# Patient Record
Sex: Male | Born: 1955 | Race: White | Hispanic: No | Marital: Married | Smoking: Never smoker
Health system: Southern US, Community
[De-identification: ages and names within clinical notes are randomized; demographics above are authoritative.]

## PROBLEM LIST (undated history)

## (undated) DIAGNOSIS — I10 Essential (primary) hypertension: Secondary | ICD-10-CM

## (undated) DIAGNOSIS — M1990 Unspecified osteoarthritis, unspecified site: Secondary | ICD-10-CM

## (undated) HISTORY — PX: NO PAST SURGERIES: SHX2092

## (undated) HISTORY — PX: TONSILLECTOMY: SUR1361

---

## 2015-04-28 ENCOUNTER — Ambulatory Visit (INDEPENDENT_AMBULATORY_CARE_PROVIDER_SITE_OTHER): Payer: BLUE CROSS/BLUE SHIELD | Admitting: Sports Medicine

## 2015-04-28 ENCOUNTER — Ambulatory Visit (INDEPENDENT_AMBULATORY_CARE_PROVIDER_SITE_OTHER): Payer: BLUE CROSS/BLUE SHIELD

## 2015-04-28 VITALS — BP 163/99 | HR 79 | Resp 18 | Wt 192.5 lb

## 2015-04-28 DIAGNOSIS — M1612 Unilateral primary osteoarthritis, left hip: Secondary | ICD-10-CM

## 2015-04-28 DIAGNOSIS — M25561 Pain in right knee: Secondary | ICD-10-CM | POA: Diagnosis not present

## 2015-04-28 DIAGNOSIS — M1712 Unilateral primary osteoarthritis, left knee: Secondary | ICD-10-CM

## 2015-04-28 DIAGNOSIS — M25562 Pain in left knee: Secondary | ICD-10-CM | POA: Diagnosis not present

## 2015-04-28 DIAGNOSIS — Z96642 Presence of left artificial hip joint: Secondary | ICD-10-CM | POA: Insufficient documentation

## 2015-04-28 MED ORDER — MELOXICAM 15 MG PO TABS: ORAL_TABLET | ORAL | Status: DC

## 2015-04-28 NOTE — Assessment & Plan Note (Signed)
We will start conservatively with meloxicam, home rehabilitation exercises, x-rays. Next line return for custom orthotics.

## 2015-04-28 NOTE — Progress Notes (Signed)
   Subjective:    I'm seeing this patient as a consultation for:  Dr. Angelena Sole  CC: Left hip and left knee pain  HPI: For months this pleasant 60 year old male sent pain he localizes on the lateral joint line of his left knee. Moderate, persistent, worse with stiffness in the morning. Has not tried any NSAIDs, did some physical therapy with improvement but was not entirely compliant. He also has pain in the localizes in the posterior left hip, also stiff in the morning, and worse with weightbearing and internal rotation. No trauma, no constitutional symptoms.  Past medical history, Surgical history, Family history not pertinant except as noted below, Social history, Allergies, and medications have been entered into the medical record, reviewed, and no changes needed.   Review of Systems: No headache, visual changes, nausea, vomiting, diarrhea, constipation, dizziness, abdominal pain, skin rash, fevers, chills, night sweats, weight loss, swollen lymph nodes, body aches, joint swelling, muscle aches, chest pain, shortness of breath, mood changes, visual or auditory hallucinations.   Objective:   General: Well Developed, well nourished, and in no acute distress.  Neuro/Psych: Alert and oriented x3, extra-ocular muscles intact, able to move all 4 extremities, sensation grossly intact. Skin: Warm and dry, no rashes noted.  Respiratory: Not using accessory muscles, speaking in full sentences, trachea midline.  Cardiovascular: Pulses palpable, no extremity edema. Abdomen: Does not appear distended. Left Knee: Normal to inspection with no erythema or effusion or obvious bony abnormalities. Palpation normal with no warmth or joint line tenderness or patellar tenderness or condyle tenderness. ROM normal in flexion and extension and lower leg rotation. Ligaments with solid consistent endpoints including ACL, PCL, LCL, MCL. Negative Mcmurray's and provocative meniscal tests. Non painful  patellar compression. Patellar and quadriceps tendons unremarkable. Hamstring and quadriceps strength is normal. Left Hip: ROM IR: 60 Deg, this motion does create pain, concordant, ER: 60 Deg, Flexion: 120 Deg, Extension: 100 Deg, Abduction: 45 Deg, Adduction: 45 Deg Strength IR: 5/5, ER: 5/5, Flexion: 5/5, Extension: 5/5, Abduction: 5/5, Adduction: 5/5 Pelvic alignment unremarkable to inspection and palpation. Standing hip rotation and gait without trendelenburg / unsteadiness. Greater trochanter without tenderness to palpation. No tenderness over piriformis. No SI joint tenderness and normal minimal SI movement.  Impression and Recommendations:   This case required medical decision making of moderate complexity.

## 2015-04-28 NOTE — Assessment & Plan Note (Signed)
As above, starting conservatively with x-rays, meloxicam, home rehabilitation exercises. Return for custom orthotics.

## 2015-04-30 ENCOUNTER — Ambulatory Visit (INDEPENDENT_AMBULATORY_CARE_PROVIDER_SITE_OTHER): Payer: BLUE CROSS/BLUE SHIELD | Admitting: Sports Medicine

## 2015-04-30 VITALS — BP 138/89 | HR 80 | Resp 18 | Wt 192.0 lb

## 2015-04-30 DIAGNOSIS — M1712 Unilateral primary osteoarthritis, left knee: Secondary | ICD-10-CM | POA: Diagnosis not present

## 2015-04-30 NOTE — Progress Notes (Signed)

## 2015-04-30 NOTE — Assessment & Plan Note (Signed)
Custom orthotics as above, return in one month. 

## 2015-05-28 ENCOUNTER — Ambulatory Visit (INDEPENDENT_AMBULATORY_CARE_PROVIDER_SITE_OTHER): Payer: BLUE CROSS/BLUE SHIELD | Admitting: Sports Medicine

## 2015-05-28 ENCOUNTER — Encounter: Payer: Self-pay | Admitting: Sports Medicine

## 2015-05-28 VITALS — BP 151/98 | HR 101 | Wt 195.0 lb

## 2015-05-28 DIAGNOSIS — M1612 Unilateral primary osteoarthritis, left hip: Secondary | ICD-10-CM

## 2015-05-28 DIAGNOSIS — M1712 Unilateral primary osteoarthritis, left knee: Secondary | ICD-10-CM

## 2015-05-28 NOTE — Assessment & Plan Note (Signed)
Pain resolved, continue meloxicam, return as needed

## 2015-05-28 NOTE — Assessment & Plan Note (Signed)
Pain resolved, continue meloxicam, return as needed 

## 2015-05-28 NOTE — Progress Notes (Signed)
  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   Review of Systems: No fevers, chills, night sweats, weight loss, chest pain, or shortness of breath.   Objective:    General: Well Developed, well nourished, and in no acute distress.  Neuro: Alert and oriented x3, extra-ocular muscles intact, sensation grossly intact.  HEENT: Normocephalic, atraumatic, pupils equal round reactive to light, neck supple, no masses, no lymphadenopathy, thyroid nonpalpable.  Skin: Warm and dry, no rashes. Cardiac: Regular rate and rhythm, no murmurs rubs or gallops, no lower extremity edema.  Respiratory: Clear to auscultation bilaterally. Not using accessory muscles, speaking in full sentences.  Impression and Recommendations:

## 2015-09-16 ENCOUNTER — Ambulatory Visit (INDEPENDENT_AMBULATORY_CARE_PROVIDER_SITE_OTHER): Payer: BLUE CROSS/BLUE SHIELD | Admitting: Sports Medicine

## 2015-09-16 ENCOUNTER — Encounter: Payer: Self-pay | Admitting: Sports Medicine

## 2015-09-16 ENCOUNTER — Ambulatory Visit (INDEPENDENT_AMBULATORY_CARE_PROVIDER_SITE_OTHER): Payer: BLUE CROSS/BLUE SHIELD

## 2015-09-16 VITALS — BP 138/84 | HR 72 | Resp 18 | Wt 195.7 lb

## 2015-09-16 DIAGNOSIS — M5412 Radiculopathy, cervical region: Secondary | ICD-10-CM

## 2015-09-16 DIAGNOSIS — M5030 Other cervical disc degeneration, unspecified cervical region: Secondary | ICD-10-CM | POA: Insufficient documentation

## 2015-09-16 DIAGNOSIS — M5031 Other cervical disc degeneration,  high cervical region: Secondary | ICD-10-CM

## 2015-09-16 MED ORDER — PREDNISONE 50 MG PO TABS: ORAL_TABLET | ORAL | Status: DC

## 2015-09-16 MED ORDER — CYCLOBENZAPRINE HCL 10 MG PO TABS: ORAL_TABLET | ORAL | Status: DC

## 2015-09-16 MED ORDER — MELOXICAM 15 MG PO TABS: ORAL_TABLET | ORAL | Status: DC

## 2015-09-16 NOTE — Assessment & Plan Note (Signed)
Left C6 distribution, periscapular and to the index finger. Prednisone, meloxicam, nighttime Flexeril. X-rays, physical therapy.  Return to see me in one month, MRI for interventional planning if no better.

## 2015-09-16 NOTE — Progress Notes (Signed)
   Subjective:    I'm seeing this patient as a consultation for:  Dr. Angelena SoleWeston Saunders  CC: Left shoulder blade pain  HPI: For the past several days this pleasant 60 year old male has had pain that he localizes along his left periscapular border, with radiation down the outer arm to the second and first fingers. Symptoms are moderate, persistent, no lower extremity symptoms, no constitutional symptoms, no trauma.  Past medical history, Surgical history, Family history not pertinant except as noted below, Social history, Allergies, and medications have been entered into the medical record, reviewed, and no changes needed.   Review of Systems: No headache, visual changes, nausea, vomiting, diarrhea, constipation, dizziness, abdominal pain, skin rash, fevers, chills, night sweats, weight loss, swollen lymph nodes, body aches, joint swelling, muscle aches, chest pain, shortness of breath, mood changes, visual or auditory hallucinations.   Objective:   General: Well Developed, well nourished, and in no acute distress.  Neuro/Psych: Alert and oriented x3, extra-ocular muscles intact, able to move all 4 extremities, sensation grossly intact. Skin: Warm and dry, no rashes noted.  Respiratory: Not using accessory muscles, speaking in full sentences, trachea midline.  Cardiovascular: Pulses palpable, no extremity edema. Abdomen: Does not appear distended. Neck: Negative spurling's Full neck range of motion Grip strength and sensation normal in bilateral hands Strength good C4 to T1 distribution No sensory change to C4 to T1 Reflexes normal  Impression and Recommendations:   This case required medical decision making of moderate complexity.

## 2015-09-22 ENCOUNTER — Telehealth: Payer: Self-pay

## 2015-09-22 DIAGNOSIS — M5412 Radiculopathy, cervical region: Secondary | ICD-10-CM

## 2015-09-22 NOTE — Telephone Encounter (Signed)
Pt states his pain is getting and would like to know what his neck step is. Please advise.

## 2015-09-23 NOTE — Telephone Encounter (Signed)
Pt advised of recommendation, verbalized understanding.  

## 2015-09-23 NOTE — Telephone Encounter (Signed)
MRI to confirm the diagnosis is the next step, ordering it

## 2015-09-25 ENCOUNTER — Ambulatory Visit (HOSPITAL_BASED_OUTPATIENT_CLINIC_OR_DEPARTMENT_OTHER)
Admission: RE | Admit: 2015-09-25 | Discharge: 2015-09-25 | Disposition: A | Discharge status: Home / Self Care | Payer: BLUE CROSS/BLUE SHIELD | Source: Ambulatory Visit | Attending: Sports Medicine | Admitting: Sports Medicine

## 2015-09-25 DIAGNOSIS — M50223 Other cervical disc displacement at C6-C7 level: Secondary | ICD-10-CM | POA: Diagnosis not present

## 2015-09-25 DIAGNOSIS — M5021 Other cervical disc displacement,  high cervical region: Secondary | ICD-10-CM | POA: Insufficient documentation

## 2015-09-25 DIAGNOSIS — M5412 Radiculopathy, cervical region: Secondary | ICD-10-CM | POA: Diagnosis present

## 2015-09-25 DIAGNOSIS — M778 Other enthesopathies, not elsewhere classified: Secondary | ICD-10-CM | POA: Insufficient documentation

## 2015-09-25 DIAGNOSIS — G542 Cervical root disorders, not elsewhere classified: Secondary | ICD-10-CM | POA: Diagnosis not present

## 2015-10-01 ENCOUNTER — Ambulatory Visit (INDEPENDENT_AMBULATORY_CARE_PROVIDER_SITE_OTHER): Payer: BLUE CROSS/BLUE SHIELD | Admitting: Sports Medicine

## 2015-10-01 DIAGNOSIS — M5412 Radiculopathy, cervical region: Secondary | ICD-10-CM | POA: Diagnosis not present

## 2015-10-01 MED ORDER — GABAPENTIN 300 MG PO CAPS: ORAL_CAPSULE | ORAL | Status: DC

## 2015-10-01 NOTE — Assessment & Plan Note (Addendum)
Classic left C6 distribution radiculopathy. At this point we are going to proceed with a left C6-C7 interlaminar epidural.  Return to see me one month after injection to evaluate response. Do think there is a relatively high percent chance that he will proceed to cervical ACDF. Discontinue Flexeril, starting gabapentin.

## 2015-10-01 NOTE — Progress Notes (Signed)
  Subjective:    CC: MRI results  HPI: This is a pleasant 60 year old male, he recently had an MRI after failing conservative measures for left cervical radiculitis, symptoms are moderate, persistent.  Past medical history, Surgical history, Family history not pertinant except as noted below, Social history, Allergies, and medications have been entered into the medical record, reviewed, and no changes needed.   Review of Systems: No fevers, chills, night sweats, weight loss, chest pain, or shortness of breath.   Objective:    General: Well Developed, well nourished, and in no acute distress.  Neuro: Alert and oriented x3, extra-ocular muscles intact, sensation grossly intact.  HEENT: Normocephalic, atraumatic, pupils equal round reactive to light, neck supple, no masses, no lymphadenopathy, thyroid nonpalpable.  Skin: Warm and dry, no rashes. Cardiac: Regular rate and rhythm, no murmurs rubs or gallops, no lower extremity edema.  Respiratory: Clear to auscultation bilaterally. Not using accessory muscles, speaking in full sentences.  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  Impression and Recommendations:    I spent 25 minutes with this patient, greater than 50% was face-to-face time counseling regarding the above diagnoses

## 2015-10-12 ENCOUNTER — Other Ambulatory Visit: Payer: BLUE CROSS/BLUE SHIELD

## 2015-10-14 ENCOUNTER — Ambulatory Visit: Payer: BLUE CROSS/BLUE SHIELD | Admitting: Sports Medicine

## 2015-10-14 ENCOUNTER — Ambulatory Visit
Admission: RE | Admit: 2015-10-14 | Discharge: 2015-10-14 | Disposition: A | Discharge status: Home / Self Care | Payer: BLUE CROSS/BLUE SHIELD | Source: Ambulatory Visit | Attending: Sports Medicine | Admitting: Sports Medicine

## 2015-10-14 MED ORDER — IOPAMIDOL (ISOVUE-M 300) INJECTION 61%
1.0000 mL | Freq: Once | INTRAMUSCULAR | Status: AC | PRN
  Administered 2015-10-14: 1 mL via EPIDURAL

## 2015-10-14 MED ORDER — TRIAMCINOLONE ACETONIDE 40 MG/ML IJ SUSP (RADIOLOGY)
60.0000 mg | Freq: Once | INTRAMUSCULAR | Status: AC
  Administered 2015-10-14: 60 mg via EPIDURAL

## 2015-10-14 NOTE — Discharge Instructions (Signed)

## 2015-11-12 ENCOUNTER — Ambulatory Visit (INDEPENDENT_AMBULATORY_CARE_PROVIDER_SITE_OTHER): Payer: BLUE CROSS/BLUE SHIELD | Admitting: Sports Medicine

## 2015-11-12 ENCOUNTER — Encounter: Payer: Self-pay | Admitting: Sports Medicine

## 2015-11-12 DIAGNOSIS — M5412 Radiculopathy, cervical region: Secondary | ICD-10-CM | POA: Diagnosis not present

## 2015-11-12 NOTE — Assessment & Plan Note (Signed)
Essentially resolved now, has actually been able to come off of gabapentin, meloxicam, and Flexeril. He tells me when it does start to come back he restarts his medications and uses Biofreeze, he can call me if this fails and we can order another epidural. He does have every tight cervical canal, and I did suspect initially that he would proceed to cervical ACDF.

## 2015-11-12 NOTE — Progress Notes (Signed)
  Subjective:    CC: Follow-up  HPI: Left cervical radiculopathy: Pain has all but resolved, he only has a bit of tingling in the tip of his index finger, otherwise he has no pain, he did have a left C6-C7 interlaminar epidural, at this point he is was feeling good enough to come off of all of his medications. Occasional mornings when he sleeps wrong he will restart his medications and use Biofreeze and this takes care of everything.  Past medical history, Surgical history, Family history not pertinant except as noted below, Social history, Allergies, and medications have been entered into the medical record, reviewed, and no changes needed.   Review of Systems: No fevers, chills, night sweats, weight loss, chest pain, or shortness of breath.   Objective:    General: Well Developed, well nourished, and in no acute distress.  Neuro: Alert and oriented x3, extra-ocular muscles intact, sensation grossly intact.  HEENT: Normocephalic, atraumatic, pupils equal round reactive to light, neck supple, no masses, no lymphadenopathy, thyroid nonpalpable.  Skin: Warm and dry, no rashes. Cardiac: Regular rate and rhythm, no murmurs rubs or gallops, no lower extremity edema.  Respiratory: Clear to auscultation bilaterally. Not using accessory muscles, speaking in full sentences. Neck: Negative spurling's Full neck range of motion Grip strength and sensation normal in bilateral hands Strength good C4 to T1 distribution No sensory change to C4 to T1 Reflexes normal  Impression and Recommendations:    Left cervical radiculopathy Essentially resolved now, has actually been able to come off of gabapentin, meloxicam, and Flexeril. He tells me when it does start to come back he restarts his medications and uses Biofreeze, he can call me if this fails and we can order another epidural. He does have every tight cervical canal, and I did suspect initially that he would proceed to cervical ACDF.

## 2018-03-28 ENCOUNTER — Ambulatory Visit (INDEPENDENT_AMBULATORY_CARE_PROVIDER_SITE_OTHER): Payer: BLUE CROSS/BLUE SHIELD | Admitting: Sports Medicine

## 2018-03-28 ENCOUNTER — Encounter: Payer: Self-pay | Admitting: Sports Medicine

## 2018-03-28 DIAGNOSIS — M1612 Unilateral primary osteoarthritis, left hip: Secondary | ICD-10-CM | POA: Diagnosis not present

## 2018-03-28 DIAGNOSIS — M1712 Unilateral primary osteoarthritis, left knee: Secondary | ICD-10-CM | POA: Diagnosis not present

## 2018-03-28 DIAGNOSIS — I10 Essential (primary) hypertension: Secondary | ICD-10-CM | POA: Diagnosis not present

## 2018-03-28 DIAGNOSIS — M5412 Radiculopathy, cervical region: Secondary | ICD-10-CM

## 2018-03-28 MED ORDER — LOSARTAN POTASSIUM-HCTZ 50-12.5 MG PO TABS: 1.0000 | ORAL_TABLET | Freq: Two times a day (BID) | ORAL | 3 refills | Status: DC

## 2018-03-28 MED ORDER — MELOXICAM 15 MG PO TABS: ORAL_TABLET | ORAL | 3 refills | Status: DC

## 2018-03-28 NOTE — Assessment & Plan Note (Signed)
Injection as above, restart meloxicam.

## 2018-03-28 NOTE — Assessment & Plan Note (Addendum)
Losartan has about a 12-hour duration, he is taking losartan/HCTZ, increasing to twice daily. Very elevated now, it still runs high at home even on losartan/HCTZ once daily.  Losartan/HCTZ not covered it twice a day, switch to valsartan/HCTZ full dose.

## 2018-03-28 NOTE — Assessment & Plan Note (Signed)
Having some right-sided radicular pain. Did well with a cervical epidural years ago. Return in a month, if persistent right-sided radicular symptoms we will proceed with epidural.

## 2018-03-28 NOTE — Assessment & Plan Note (Signed)
Injection as above.   Restart meloxicam.

## 2018-03-28 NOTE — Progress Notes (Addendum)
Subjective:    CC: Hip and knee pain  HPI: Knee pain: Left-sided, lateral and under the patella, known knee osteoarthritis, responded well to meloxicam several years ago.  Left hip pain: Known osteoarthritis, also responded well to meloxicam, rehab exercises, has been out of meloxicam for some time now.  Cervical DDD: History of left-sided radiculitis, now on the right, responded well to a cervical epidural and rehab exercises in the past.  Hypertension: Uncontrolled, only on losartan/HCTZ once a day.  I reviewed the past medical history, family history, social history, surgical history, and allergies today and no changes were needed.  Please see the problem list section below in epic for further details.  Past Medical History: History reviewed. No pertinent past medical history. Past Surgical History: Past Surgical History:  Procedure Laterality Date  . NO PAST SURGERIES     Social History: Social History   Socioeconomic History  . Marital status: Unknown    Spouse name: Not on file  . Number of children: Not on file  . Years of education: Not on file  . Highest education level: Not on file  Occupational History  . Not on file  Social Needs  . Financial resource strain: Not on file  . Food insecurity:    Worry: Not on file    Inability: Not on file  . Transportation needs:    Medical: Not on file    Non-medical: Not on file  Tobacco Use  . Smoking status: Never Smoker  . Smokeless tobacco: Never Used  Substance and Sexual Activity  . Alcohol use: Yes    Alcohol/week: 2.0 - 3.0 standard drinks    Types: 2 - 3 Standard drinks or equivalent per week  . Drug use: Not on file  . Sexual activity: Not on file  Lifestyle  . Physical activity:    Days per week: Not on file    Minutes per session: Not on file  . Stress: Not on file  Relationships  . Social connections:    Talks on phone: Not on file    Gets together: Not on file    Attends religious service: Not on  file    Active member of club or organization: Not on file    Attends meetings of clubs or organizations: Not on file    Relationship status: Not on file  Other Topics Concern  . Not on file  Social History Narrative  . Not on file   Family History: History reviewed. No pertinent family history. Allergies: Allergies  Allergen Reactions  . Penicillins Rash   Medications: See med rec.  Review of Systems: No fevers, chills, night sweats, weight loss, chest pain, or shortness of breath.   Objective:    General: Well Developed, well nourished, and in no acute distress.  Neuro: Alert and oriented x3, extra-ocular muscles intact, sensation grossly intact.  HEENT: Normocephalic, atraumatic, pupils equal round reactive to light, neck supple, no masses, no lymphadenopathy, thyroid nonpalpable.  Skin: Warm and dry, no rashes. Cardiac: Regular rate and rhythm, no murmurs rubs or gallops, no lower extremity edema.  Respiratory: Clear to auscultation bilaterally. Not using accessory muscles, speaking in full sentences. Left knee: Mild swelling, tender to palpation at the lateral patellar facet.   ROM normal in flexion and extension and lower leg rotation. Ligaments with solid consistent endpoints including ACL, PCL, LCL, MCL. Negative Mcmurray's and provocative meniscal tests. Non painful patellar compression. Patellar and quadriceps tendons unremarkable. Hamstring and quadriceps strength is normal. Left  hip: ROM IR: 10 Deg with reproduction of pain, ER: 60 Deg, Flexion: 120 Deg, Extension: 100 Deg, Abduction: 45 Deg, Adduction: 45 Deg Strength IR: 5/5, ER: 5/5, Flexion: 5/5, Extension: 5/5, Abduction: 5/5, Adduction: 5/5 Pelvic alignment unremarkable to inspection and palpation. Standing hip rotation and gait without trendelenburg / unsteadiness. Greater trochanter without tenderness to palpation. No tenderness over piriformis. No SI joint tenderness and normal minimal SI  movement.  Procedure: Real-time Ultrasound Guided Injection of left hip joint Device: GE Logiq E  Verbal informed consent obtained.  Time-out conducted.  Noted no overlying erythema, induration, or other signs of local infection.  Skin prepped in a sterile fashion.  Local anesthesia: Topical Ethyl chloride.  With sterile technique and under real time ultrasound guidance: Noted profoundly arthritic joint, 22-gauge spinal needle advanced to the femoral head/neck junction, contacted bone and injected 1 cc Kenalog 40, 2 cc lidocaine, 2 cc bupivacaine. Completed without difficulty  Pain immediately resolved suggesting accurate placement of the medication.  Advised to call if fevers/chills, erythema, induration, drainage, or persistent bleeding.  Images permanently stored and available for review in the ultrasound unit.  Impression: Technically successful ultrasound guided injection.  Procedure: Real-time Ultrasound Guided Injection of left knee joint Device: GE Logiq E  Verbal informed consent obtained.  Time-out conducted.  Noted no overlying erythema, induration, or other signs of local infection.  Skin prepped in a sterile fashion.  Local anesthesia: Topical Ethyl chloride.  With sterile technique and under real time ultrasound guidance: Noted mild effusion, 25-gauge needle advanced into the suprapatellar recess, injected 1 cc Kenalog 40, 2 cc lidocaine, 2 cc bupivacaine. Completed without difficulty  Pain immediately resolved suggesting accurate placement of the medication.  Advised to call if fevers/chills, erythema, induration, drainage, or persistent bleeding.  Images permanently stored and available for review in the ultrasound unit.  Impression: Technically successful ultrasound guided injection.  Impression and Recommendations:    Primary osteoarthritis of left hip Injection as above, restart meloxicam.  Primary osteoarthritis of left knee Injection as above.   Restart  meloxicam.  Benign essential hypertension Losartan has about a 12-hour duration, he is taking losartan/HCTZ, increasing to twice daily. Very elevated now, it still runs high at home even on losartan/HCTZ once daily.  Losartan/HCTZ not covered it twice a day, switch to valsartan/HCTZ full dose.  Left cervical radiculopathy Having some right-sided radicular pain. Did well with a cervical epidural years ago. Return in a month, if persistent right-sided radicular symptoms we will proceed with epidural. ___________________________________________ Ihor Austinhomas J. Benjamin Stainhekkekandam, M.D., ABFM., CAQSM. Primary Care and Sports Medicine [facility]  Adjunct Professor of Family Medicine  [employer]

## 2018-04-11 ENCOUNTER — Other Ambulatory Visit: Payer: Self-pay | Admitting: Sports Medicine

## 2018-04-11 DIAGNOSIS — I10 Essential (primary) hypertension: Secondary | ICD-10-CM

## 2018-04-11 MED ORDER — IRBESARTAN-HYDROCHLOROTHIAZIDE 300-12.5 MG PO TABS: 1.0000 | ORAL_TABLET | Freq: Every day | ORAL | 3 refills | Status: DC

## 2018-04-11 MED ORDER — VALSARTAN-HYDROCHLOROTHIAZIDE 320-25 MG PO TABS: 1.0000 | ORAL_TABLET | Freq: Every day | ORAL | 0 refills | Status: DC

## 2018-04-11 NOTE — Addendum Note (Signed)
Addended by: Monica Becton on: 04/11/2018 09:26 AM   Modules accepted: Orders

## 2018-04-21 ENCOUNTER — Encounter: Payer: Self-pay | Admitting: Sports Medicine

## 2018-04-25 ENCOUNTER — Encounter: Payer: Self-pay | Admitting: Sports Medicine

## 2018-04-25 ENCOUNTER — Ambulatory Visit (INDEPENDENT_AMBULATORY_CARE_PROVIDER_SITE_OTHER): Payer: BLUE CROSS/BLUE SHIELD | Admitting: Sports Medicine

## 2018-04-25 DIAGNOSIS — M1712 Unilateral primary osteoarthritis, left knee: Secondary | ICD-10-CM

## 2018-04-25 DIAGNOSIS — M1612 Unilateral primary osteoarthritis, left hip: Secondary | ICD-10-CM

## 2018-04-25 DIAGNOSIS — M5412 Radiculopathy, cervical region: Secondary | ICD-10-CM

## 2018-04-25 NOTE — Assessment & Plan Note (Signed)
Previous cervical epidural was years ago, ordering a repeat epidural, this time of the right C6-C7.

## 2018-04-25 NOTE — Progress Notes (Signed)
  Subjective:    CC: Follow-up  HPI: Hip osteoarthritis: Resolved after injection.  Knee osteoarthritis: Pain resolved after injection last visit.  Neck pain: Persistent right-sided neck pain, did well after a cervical epidural several years ago.  I reviewed the past medical history, family history, social history, surgical history, and allergies today and no changes were needed.  Please see the problem list section below in epic for further details.  Past Medical History: No past medical history on file. Past Surgical History: Past Surgical History:  Procedure Laterality Date  . NO PAST SURGERIES     Social History: Social History   Socioeconomic History  . Marital status: Unknown    Spouse name: Not on file  . Number of children: Not on file  . Years of education: Not on file  . Highest education level: Not on file  Occupational History  . Not on file  Social Needs  . Financial resource strain: Not on file  . Food insecurity:    Worry: Not on file    Inability: Not on file  . Transportation needs:    Medical: Not on file    Non-medical: Not on file  Tobacco Use  . Smoking status: Never Smoker  . Smokeless tobacco: Never Used  Substance and Sexual Activity  . Alcohol use: Yes    Alcohol/week: 2.0 - 3.0 standard drinks    Types: 2 - 3 Standard drinks or equivalent per week  . Drug use: Not on file  . Sexual activity: Not on file  Lifestyle  . Physical activity:    Days per week: Not on file    Minutes per session: Not on file  . Stress: Not on file  Relationships  . Social connections:    Talks on phone: Not on file    Gets together: Not on file    Attends religious service: Not on file    Active member of club or organization: Not on file    Attends meetings of clubs or organizations: Not on file    Relationship status: Not on file  Other Topics Concern  . Not on file  Social History Narrative  . Not on file   Family History: No family history on  file. Allergies: Allergies  Allergen Reactions  . Penicillins Rash   Medications: See med rec.  Review of Systems: No fevers, chills, night sweats, weight loss, chest pain, or shortness of breath.   Objective:    General: Well Developed, well nourished, and in no acute distress.  Neuro: Alert and oriented x3, extra-ocular muscles intact, sensation grossly intact.  HEENT: Normocephalic, atraumatic, pupils equal round reactive to light, neck supple, no masses, no lymphadenopathy, thyroid nonpalpable.  Skin: Warm and dry, no rashes. Cardiac: Regular rate and rhythm, no murmurs rubs or gallops, no lower extremity edema.  Respiratory: Clear to auscultation bilaterally. Not using accessory muscles, speaking in full sentences.  Impression and Recommendations:    Primary osteoarthritis of left knee Resolved after injection at the last visit.  Primary osteoarthritis of left hip Resolved after injection at the last visit.   He does get occasional minimal pain, this resolves with meloxicam.  Left cervical radiculopathy Previous cervical epidural was years ago, ordering a repeat epidural, this time of the right C6-C7. ___________________________________________ Ihor Austin. Benjamin Stain, M.D., ABFM., CAQSM. Primary Care and Sports Medicine [facility]  Adjunct Professor of Family Medicine  [employer]

## 2018-04-25 NOTE — Assessment & Plan Note (Signed)
Resolved after injection at the last visit.   He does get occasional minimal pain, this resolves with meloxicam.

## 2018-04-25 NOTE — Assessment & Plan Note (Signed)
Resolved after injection at the last visit 

## 2018-05-19 ENCOUNTER — Encounter: Payer: Self-pay | Admitting: Sports Medicine

## 2018-05-28 ENCOUNTER — Ambulatory Visit
Admission: RE | Admit: 2018-05-28 | Discharge: 2018-05-28 | Disposition: A | Discharge status: Home / Self Care | Payer: Medicare HMO | Source: Ambulatory Visit | Attending: Sports Medicine | Admitting: Sports Medicine

## 2018-05-28 MED ORDER — IOHEXOL 300 MG/ML  SOLN
1.0000 mL | Freq: Once | INTRAMUSCULAR | Status: AC | PRN
  Administered 2018-05-28: 1 mL via EPIDURAL

## 2018-05-28 MED ORDER — TRIAMCINOLONE ACETONIDE 40 MG/ML IJ SUSP (RADIOLOGY)
60.0000 mg | Freq: Once | INTRAMUSCULAR | Status: AC
  Administered 2018-05-28: 60 mg via EPIDURAL

## 2018-05-28 NOTE — Discharge Instructions (Signed)

## 2018-07-10 ENCOUNTER — Telehealth: Payer: Self-pay | Admitting: Family Medicine

## 2018-07-10 NOTE — Telephone Encounter (Signed)
Pt sent mychart message for an appointment for updated inserts.  He is aware that we're not doing orthotics at this time but he wants to know if he should come in for recheck?

## 2018-07-10 NOTE — Telephone Encounter (Signed)
It looks like he recently had his epidural, if things are going well then he does not need to come in.  There is an outside facility that can build custom orthotics however they are significantly more expensive.

## 2018-07-11 NOTE — Telephone Encounter (Signed)
Pt aware.

## 2018-07-22 ENCOUNTER — Other Ambulatory Visit: Payer: Self-pay | Admitting: Sports Medicine

## 2018-07-22 DIAGNOSIS — M5412 Radiculopathy, cervical region: Secondary | ICD-10-CM

## 2018-08-03 ENCOUNTER — Other Ambulatory Visit: Payer: Self-pay | Admitting: Sports Medicine

## 2018-08-03 DIAGNOSIS — I10 Essential (primary) hypertension: Secondary | ICD-10-CM

## 2018-10-02 ENCOUNTER — Encounter: Payer: Self-pay | Admitting: Sports Medicine

## 2018-10-02 ENCOUNTER — Ambulatory Visit (INDEPENDENT_AMBULATORY_CARE_PROVIDER_SITE_OTHER): Payer: Medicare HMO | Admitting: Sports Medicine

## 2018-10-02 ENCOUNTER — Other Ambulatory Visit: Payer: Self-pay

## 2018-10-02 DIAGNOSIS — M1712 Unilateral primary osteoarthritis, left knee: Secondary | ICD-10-CM | POA: Diagnosis not present

## 2018-10-02 DIAGNOSIS — M5412 Radiculopathy, cervical region: Secondary | ICD-10-CM

## 2018-10-02 DIAGNOSIS — M1612 Unilateral primary osteoarthritis, left hip: Secondary | ICD-10-CM | POA: Diagnosis not present

## 2018-10-02 NOTE — Assessment & Plan Note (Signed)
Doing very well post injection in January, continue rehab exercises, with occasional meloxicam. 

## 2018-10-02 NOTE — Assessment & Plan Note (Signed)
Continues to do well after cervical epidural, we are reinstituting rehabilitation exercises, distribution is right C8. He will also work on his ergonomics, avoiding prolonged downgaze.

## 2018-10-02 NOTE — Assessment & Plan Note (Signed)
Doing very well post injection in January, continue rehab exercises, with occasional meloxicam.

## 2018-10-02 NOTE — Progress Notes (Signed)
Subjective:    CC: Follow-up multiple issues  HPI: This is a pleasant 63 year old male, we have treated him for multiple issues, knee osteoarthritis, hip osteoarthritis and right cervical radiculitis, he did well after injections back in January, for the most part he has no pain, and when he does have occasional pain he will use meloxicam with good effect.  I reviewed the past medical history, family history, social history, surgical history, and allergies today and no changes were needed.  Please see the problem list section below in epic for further details.  Past Medical History: No past medical history on file. Past Surgical History: Past Surgical History:  Procedure Laterality Date  . NO PAST SURGERIES     Social History: Social History   Socioeconomic History  . Marital status: Unknown    Spouse name: Not on file  . Number of children: Not on file  . Years of education: Not on file  . Highest education level: Not on file  Occupational History  . Not on file  Social Needs  . Financial resource strain: Not on file  . Food insecurity    Worry: Not on file    Inability: Not on file  . Transportation needs    Medical: Not on file    Non-medical: Not on file  Tobacco Use  . Smoking status: Never Smoker  . Smokeless tobacco: Never Used  Substance and Sexual Activity  . Alcohol use: Yes    Alcohol/week: 2.0 - 3.0 standard drinks    Types: 2 - 3 Standard drinks or equivalent per week  . Drug use: Not on file  . Sexual activity: Not on file  Lifestyle  . Physical activity    Days per week: Not on file    Minutes per session: Not on file  . Stress: Not on file  Relationships  . Social Herbalist on phone: Not on file    Gets together: Not on file    Attends religious service: Not on file    Active member of club or organization: Not on file    Attends meetings of clubs or organizations: Not on file    Relationship status: Not on file  Other Topics  Concern  . Not on file  Social History Narrative  . Not on file   Family History: No family history on file. Allergies: Allergies  Allergen Reactions  . Penicillins Rash   Medications: See med rec.  Review of Systems: No fevers, chills, night sweats, weight loss, chest pain, or shortness of breath.   Objective:    General: Well Developed, well nourished, and in no acute distress.  Neuro: Alert and oriented x3, extra-ocular muscles intact, sensation grossly intact.  HEENT: Normocephalic, atraumatic, pupils equal round reactive to light, neck supple, no masses, no lymphadenopathy, thyroid nonpalpable.  Skin: Warm and dry, no rashes. Cardiac: Regular rate and rhythm, no murmurs rubs or gallops, no lower extremity edema.  Respiratory: Clear to auscultation bilaterally. Not using accessory muscles, speaking in full sentences.  Impression and Recommendations:    Left cervical radiculopathy Continues to do well after cervical epidural, we are reinstituting rehabilitation exercises, distribution is right C8. He will also work on his ergonomics, avoiding prolonged downgaze.  Primary osteoarthritis of left hip Doing very well post injection in January, continue rehab exercises, with occasional meloxicam.  Primary osteoarthritis of left knee Doing very well post injection in January, continue rehab exercises, with occasional meloxicam.   ___________________________________________ Gwen Her. Dianah Field,  M.D., ABFM., CAQSM. Primary Care and Sports Medicine [facility]  Adjunct Professor of [employer]

## 2018-11-06 ENCOUNTER — Encounter: Payer: Self-pay | Admitting: Sports Medicine

## 2018-11-06 DIAGNOSIS — M5412 Radiculopathy, cervical region: Secondary | ICD-10-CM

## 2018-11-06 NOTE — Telephone Encounter (Signed)
Epidural ordered, please contact [facility] for scheduling. 

## 2018-11-06 NOTE — Assessment & Plan Note (Signed)
Did really well after a cervical epidural approximately 5 months ago, repeating.  Distribution is right C8.

## 2018-11-07 NOTE — Telephone Encounter (Signed)
Left vm with Roberta. 

## 2018-11-14 ENCOUNTER — Encounter: Payer: Self-pay | Admitting: Sports Medicine

## 2018-11-18 ENCOUNTER — Ambulatory Visit
Admission: RE | Admit: 2018-11-18 | Discharge: 2018-11-18 | Disposition: A | Discharge status: Home / Self Care | Payer: Medicare HMO | Source: Ambulatory Visit | Attending: Sports Medicine | Admitting: Sports Medicine

## 2018-11-18 ENCOUNTER — Other Ambulatory Visit: Payer: Self-pay

## 2018-11-18 MED ORDER — TRIAMCINOLONE ACETONIDE 40 MG/ML IJ SUSP (RADIOLOGY)
60.0000 mg | Freq: Once | INTRAMUSCULAR | Status: AC
  Administered 2018-11-18: 14:00:00 60 mg via EPIDURAL

## 2018-11-18 MED ORDER — IOPAMIDOL (ISOVUE-M 300) INJECTION 61%
1.0000 mL | Freq: Once | INTRAMUSCULAR | Status: AC
  Administered 2018-11-18: 1 mL via EPIDURAL

## 2018-11-18 NOTE — Discharge Instructions (Signed)

## 2018-11-27 ENCOUNTER — Ambulatory Visit (INDEPENDENT_AMBULATORY_CARE_PROVIDER_SITE_OTHER): Payer: Medicare HMO | Admitting: Sports Medicine

## 2018-11-27 ENCOUNTER — Other Ambulatory Visit: Payer: Self-pay

## 2018-11-27 ENCOUNTER — Encounter: Payer: Self-pay | Admitting: Sports Medicine

## 2018-11-27 ENCOUNTER — Ambulatory Visit (INDEPENDENT_AMBULATORY_CARE_PROVIDER_SITE_OTHER): Payer: Medicare HMO

## 2018-11-27 DIAGNOSIS — M1712 Unilateral primary osteoarthritis, left knee: Secondary | ICD-10-CM | POA: Diagnosis not present

## 2018-11-27 DIAGNOSIS — M1612 Unilateral primary osteoarthritis, left hip: Secondary | ICD-10-CM

## 2018-11-27 NOTE — Assessment & Plan Note (Signed)
Repeat injection today, last injected in January 2020. Repeat x-rays, I do think he is approaching arthroplasty.

## 2018-11-27 NOTE — Assessment & Plan Note (Signed)
Repeat left injection today, last injected in January. His left ankle was somewhat swollen, I think it may just be dependent edema from his knee but it does not resolve after knee injection we can further evaluate it. 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

## 2018-11-27 NOTE — Progress Notes (Signed)
Subjective:    CC: Hip and knee pain  HPI: Left hip osteoarthritis: Has done well in the past with hip joint injections, last of which was approximately 9 months ago.  Now having a recurrence of pain, moderate, persistent, localized in the groin without radiation, worse with weightbearing.  Left knee osteoarthritis: Did well after an injection back in January, now having recurrence of pain, medial joint line, moderate swelling, localized without radiation.  I reviewed the past medical history, family history, social history, surgical history, and allergies today and no changes were needed.  Please see the problem list section below in epic for further details.  Past Medical History: No past medical history on file. Past Surgical History: Past Surgical History:  Procedure Laterality Date  . NO PAST SURGERIES     Social History: Social History   Socioeconomic History  . Marital status: Unknown    Spouse name: Not on file  . Number of children: Not on file  . Years of education: Not on file  . Highest education level: Not on file  Occupational History  . Not on file  Social Needs  . Financial resource strain: Not on file  . Food insecurity    Worry: Not on file    Inability: Not on file  . Transportation needs    Medical: Not on file    Non-medical: Not on file  Tobacco Use  . Smoking status: Never Smoker  . Smokeless tobacco: Never Used  Substance and Sexual Activity  . Alcohol use: Yes    Alcohol/week: 2.0 - 3.0 standard drinks    Types: 2 - 3 Standard drinks or equivalent per week  . Drug use: Not on file  . Sexual activity: Not on file  Lifestyle  . Physical activity    Days per week: Not on file    Minutes per session: Not on file  . Stress: Not on file  Relationships  . Social Musicianconnections    Talks on phone: Not on file    Gets together: Not on file    Attends religious service: Not on file    Active member of club or organization: Not on file    Attends  meetings of clubs or organizations: Not on file    Relationship status: Not on file  Other Topics Concern  . Not on file  Social History Narrative  . Not on file   Family History: No family history on file. Allergies: Allergies  Allergen Reactions  . Penicillins Rash   Medications: See med rec.  Review of Systems: No fevers, chills, night sweats, weight loss, chest pain, or shortness of breath.   Objective:    General: Well Developed, well nourished, and in no acute distress.  Neuro: Alert and oriented x3, extra-ocular muscles intact, sensation grossly intact.  HEENT: Normocephalic, atraumatic, pupils equal round reactive to light, neck supple, no masses, no lymphadenopathy, thyroid nonpalpable.  Skin: Warm and dry, no rashes. Cardiac: Regular rate and rhythm, no murmurs rubs or gallops, no lower extremity edema.  Respiratory: Clear to auscultation bilaterally. Not using accessory muscles, speaking in full sentences.  Procedure: Real-time Ultrasound Guided injection of the left knee Device: GE Logiq E  Verbal informed consent obtained.  Time-out conducted.  Noted no overlying erythema, induration, or other signs of local infection.  Skin prepped in a sterile fashion.  Local anesthesia: Topical Ethyl chloride.  With sterile technique and under real time ultrasound guidance:  1 cc Kenalog 40, 2 cc lidocaine, 2  cc bupivacaine injected easily Completed without difficulty  Pain immediately resolved suggesting accurate placement of the medication.  Advised to call if fevers/chills, erythema, induration, drainage, or persistent bleeding.  Images permanently stored and available for review in the ultrasound unit.  Impression: Technically successful ultrasound guided injection.  Procedure: Real-time Ultrasound Guided injection of the left hip Device: GE Logiq E  Verbal informed consent obtained.  Time-out conducted.  Noted no overlying erythema, induration, or other signs of local  infection.  Skin prepped in a sterile fashion.  Local anesthesia: Topical Ethyl chloride.  With sterile technique and under real time ultrasound guidance:  1 cc Kenalog 40, 2 cc lidocaine, 2 cc bupivacaine injected easily Completed without difficulty  Pain immediately resolved suggesting accurate placement of the medication.  Advised to call if fevers/chills, erythema, induration, drainage, or persistent bleeding.  Images permanently stored and available for review in the ultrasound unit.  Impression: Technically successful ultrasound guided injection.  Impression and Recommendations:    Primary osteoarthritis of left hip Repeat injection today, last injected in January 2020. Repeat x-rays, I do think he is approaching arthroplasty.  Primary osteoarthritis of left knee Repeat left injection today, last injected in January. His left ankle was somewhat swollen, I think it may just be dependent edema from his knee but it does not resolve after knee injection we can further evaluate it. 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   ___________________________________________ Gwen Her. Dianah Field, M.D., ABFM., CAQSM. Primary Care and Sports Medicine [facility]  Adjunct Professor of [employer]

## 2018-11-28 ENCOUNTER — Encounter: Payer: Self-pay | Admitting: Sports Medicine

## 2018-11-28 DIAGNOSIS — M1612 Unilateral primary osteoarthritis, left hip: Secondary | ICD-10-CM

## 2018-12-05 ENCOUNTER — Encounter: Payer: Self-pay | Admitting: Sports Medicine

## 2018-12-05 DIAGNOSIS — M5412 Radiculopathy, cervical region: Secondary | ICD-10-CM

## 2018-12-05 MED ORDER — MELOXICAM 15 MG PO TABS: ORAL_TABLET | ORAL | 3 refills | Status: DC

## 2018-12-24 ENCOUNTER — Encounter: Payer: Self-pay | Admitting: Sports Medicine

## 2018-12-25 NOTE — Telephone Encounter (Signed)
To [employer] approval please, left knee.

## 2018-12-30 NOTE — Telephone Encounter (Signed)
I have sent everything to insurance and waiting on a response.

## 2019-01-06 ENCOUNTER — Encounter: Payer: Self-pay | Admitting: Sports Medicine

## 2019-01-06 ENCOUNTER — Other Ambulatory Visit: Payer: Self-pay | Admitting: Orthopedic Surgery

## 2019-01-06 ENCOUNTER — Telehealth: Payer: Self-pay | Admitting: Sports Medicine

## 2019-01-06 NOTE — Telephone Encounter (Signed)
Received a fax from insurnce that [place] is approved from 01/03/2019 through 04/05/2019. Patient is aware and will think about the estimate of the cost of the injection. He also wanted to touch base with the provider about moving his hip surgery. Message sent to Dr. Darene Lamer.

## 2019-01-06 NOTE — Telephone Encounter (Signed)
No need to postpone, knee injections he can just schedule them all in.

## 2019-01-06 NOTE — Telephone Encounter (Signed)
Left brief VM for patient that he could schedule the injections before his surgery and to call back with any questions.

## 2019-01-06 NOTE — Telephone Encounter (Signed)
Patient will start his injections tomorrow to get them completed before his surgery. He did not have any questions.

## 2019-01-06 NOTE — Telephone Encounter (Signed)
I called patient to get him scheduled for Orthovsic since I just got the approval and he states this his hip surgery is scheduled for 02/07/2019. Does he need to move this out a week or so or should he postpone getting the injections? He does not want to cause problems with his hip physical therapy afterwards? Please advise. Thanks.

## 2019-01-07 ENCOUNTER — Ambulatory Visit (INDEPENDENT_AMBULATORY_CARE_PROVIDER_SITE_OTHER): Payer: Medicare HMO | Admitting: Sports Medicine

## 2019-01-07 ENCOUNTER — Other Ambulatory Visit: Payer: Self-pay | Admitting: Orthopedic Surgery

## 2019-01-07 ENCOUNTER — Other Ambulatory Visit: Payer: Self-pay

## 2019-01-07 DIAGNOSIS — M1712 Unilateral primary osteoarthritis, left knee: Secondary | ICD-10-CM | POA: Diagnosis not present

## 2019-01-07 NOTE — Progress Notes (Signed)
   Procedure: Real-time Ultrasound Guided injection of the left knee Device: GE Logiq E  Verbal informed consent obtained.  Time-out conducted.  Noted no overlying erythema, induration, or other signs of local infection.  Skin prepped in a sterile fashion.  Local anesthesia: Topical Ethyl chloride.  With sterile technique and under real time ultrasound guidance:  30 mg/2 mL of OrthoVisc (sodium hyaluronate) in a prefilled syringe was injected easily into the knee through a 22-gauge needle. Completed without difficulty  Pain immediately resolved suggesting accurate placement of the medication.  Advised to call if fevers/chills, erythema, induration, drainage, or persistent bleeding.  Images permanently stored and available for review in the ultrasound unit.  Impression: Technically successful ultrasound guided injection. 

## 2019-01-07 NOTE — Assessment & Plan Note (Signed)
Orthovisc No. 1 of 4 into the left knee, return in 1 week for #2 of 4.

## 2019-01-08 ENCOUNTER — Other Ambulatory Visit: Payer: Self-pay | Admitting: Orthopedic Surgery

## 2019-01-14 ENCOUNTER — Ambulatory Visit (INDEPENDENT_AMBULATORY_CARE_PROVIDER_SITE_OTHER): Payer: Medicare HMO | Admitting: Sports Medicine

## 2019-01-14 ENCOUNTER — Other Ambulatory Visit: Payer: Self-pay

## 2019-01-14 DIAGNOSIS — M1712 Unilateral primary osteoarthritis, left knee: Secondary | ICD-10-CM | POA: Diagnosis not present

## 2019-01-14 NOTE — Assessment & Plan Note (Signed)
Orthovisc No. 2 of 4 into the left knee, return in 1 week for #3 of 4 

## 2019-01-14 NOTE — Progress Notes (Signed)
   Procedure: Real-time Ultrasound Guided injection of the left knee Device: GE Logiq E  Verbal informed consent obtained.  Time-out conducted.  Noted no overlying erythema, induration, or other signs of local infection.  Skin prepped in a sterile fashion.  Local anesthesia: Topical Ethyl chloride.  With sterile technique and under real time ultrasound guidance:  30 mg/2 mL of OrthoVisc (sodium hyaluronate) in a prefilled syringe was injected easily into the knee through a 22-gauge needle. Completed without difficulty  Pain immediately resolved suggesting accurate placement of the medication.  Advised to call if fevers/chills, erythema, induration, drainage, or persistent bleeding.  Images permanently stored and available for review in the ultrasound unit.  Impression: Technically successful ultrasound guided injection. 

## 2019-01-21 ENCOUNTER — Ambulatory Visit (INDEPENDENT_AMBULATORY_CARE_PROVIDER_SITE_OTHER): Payer: Medicare HMO | Admitting: Sports Medicine

## 2019-01-21 ENCOUNTER — Other Ambulatory Visit: Payer: Self-pay

## 2019-01-21 DIAGNOSIS — M1712 Unilateral primary osteoarthritis, left knee: Secondary | ICD-10-CM

## 2019-01-21 NOTE — Progress Notes (Signed)
   Procedure: Real-time Ultrasound Guidedinjection of the left knee Device: GE Logiq E  Verbal informed consent obtained.  Time-out conducted.  Noted no overlying erythema, induration, or other signs of local infection.  Skin prepped in a sterile fashin.  Local anesthesia: Topical Ethyl chloride.  With sterile technique and under real time ultrasound guidance: 30 mg/2 mL of OrthoVisc (sodium hyaluronate) in a prefilled syringe was injected easily into the knee through a 22-gauge needle. Completed without difficulty  Pain immediately resolved suggesting accurate placement of the medication.  Advised to call if fevers/chills, erythema, induration, drainage, or persistent bleeding.  Images permanently stored and available for review in the ultrasound unit.  Impression: Technically successful ultrasound guided injection.o

## 2019-01-21 NOTE — Assessment & Plan Note (Signed)
Orthovisc No. 3 of 4 into the left knee, return in 1 week for #4 of 4. 

## 2019-01-28 ENCOUNTER — Ambulatory Visit (INDEPENDENT_AMBULATORY_CARE_PROVIDER_SITE_OTHER): Payer: Medicare HMO | Admitting: Sports Medicine

## 2019-01-28 ENCOUNTER — Other Ambulatory Visit: Payer: Self-pay

## 2019-01-28 DIAGNOSIS — M1712 Unilateral primary osteoarthritis, left knee: Secondary | ICD-10-CM

## 2019-01-28 NOTE — Progress Notes (Signed)
   Procedure: Real-time Ultrasound Guided injection of the left knee Device: GE Logiq E  Verbal informed consent obtained.  Time-out conducted.  Noted no overlying erythema, induration, or other signs of local infection.  Skin prepped in a sterile fashion.  Local anesthesia: Topical Ethyl chloride.  With sterile technique and under real time ultrasound guidance:  30 mg/2 mL of OrthoVisc (sodium hyaluronate) in a prefilled syringe was injected easily into the knee through a 22-gauge needle. Completed without difficulty  Pain immediately resolved suggesting accurate placement of the medication.  Advised to call if fevers/chills, erythema, induration, drainage, or persistent bleeding.  Images permanently stored and available for review in the ultrasound unit.  Impression: Technically successful ultrasound guided injection. 

## 2019-01-28 NOTE — Assessment & Plan Note (Signed)
Orthovisc No. 4 of 4 to the left knee, still not having significant relief, he can continue meloxicam. We will do virtual Doximity follow-up in 4 weeks. If he is still not doing well he will need total knee arthroplasty.

## 2019-02-03 NOTE — Patient Instructions (Addendum)
DUE TO COVID-19 ONLY ONE VISITOR IS ALLOWED TO COME WITH YOU AND STAY IN THE WAITING ROOM ONLY DURING PRE OP AND PROCEDURE DAY OF SURGERY. THE 1 VISITOR MAY VISIT WITH YOU AFTER SURGERY IN YOUR PRIVATE ROOM DURING VISITING HOURS ONLY!  YOU NEED TO HAVE A COVID 19 TEST ON 02-04-19  @ 2:15 PM, THIS TEST MUST BE DONE BEFORE SURGERY, COME  [place], [place] , [address].  ([place]) ONCE YOUR COVID TEST IS COMPLETED, PLEASE BEGIN THE QUARANTINE INSTRUCTIONS AS OUTLINED IN YOUR HANDOUT.                Fernando Ayers  02/03/2019   Your procedure is scheduled on: 02-07-19     Report to [facility] Main  Entrance    Report to Short Stay at 5:30  AM     Call this number if you have problems the morning of surgery [phone]    Remember: NO SOLID FOOD AFTER MIDNIGHT THE NIGHT PRIOR TO SURGERY. NOTHING BY MOUTH EXCEPT CLEAR LIQUIDS UNTIL 4:30 AM . PLEASE FINISH ENSURE DRINK PER SURGEON ORDER  WHICH NEEDS TO BE COMPLETED AT 4:30  AM.   CLEAR LIQUID DIET   Foods Allowed                                                                     Foods Excluded  Coffee and tea, regular and decaf                             liquids that you cannot  Plain Jell-O any favor except red or purple                                           see through such as: Fruit ices (not with fruit pulp)                                     milk, soups, orange juice  Iced Popsicles                                    All solid food Carbonated beverages, regular and diet                                    Cranberry, grape and apple juices Sports drinks like Gatorade Lightly seasoned clear broth or consume(fat free) Sugar, honey syrup  Sample Menu Breakfast                                Lunch                                     Supper Cranberry juice  Beef broth                            Chicken broth Jell-O                                     Grape juice                            Apple juice Coffee or tea                        Jell-O                                      Popsicle                                                Coffee or tea                        Coffee or tea  _____________________________________________________________________     Take these medicines the morning of surgery with A SIP OF WATER: Antivert (Meclizine)   BRUSH YOUR TEETH MORNING OF SURGERY AND RINSE YOUR MOUTH OUT, NO CHEWING GUM CANDY OR MINTS.                                 You may not have any metal on your body including hair pins and              piercings     Do not wear jewelry, cologne, lotions, powders or deodorant                         Men may shave face and neck.   Do not bring valuables to the hospital. [facility] IS NOT             RESPONSIBLE   FOR VALUABLES.  Contacts, dentures or bridgework may not be worn into surgery.  Leave suitcase in the car. After surgery it may be brought to your room.     Patients discharged the day of surgery will not be allowed to drive home. IF YOU ARE HAVING SURGERY AND GOING HOME THE SAME DAY, YOU MUST HAVE AN ADULT TO DRIVE YOU HOME AND BE WITH YOU FOR 24 HOURS. YOU MAY GO HOME BY TAXI OR UBER OR ORTHERWISE, BUT AN ADULT MUST ACCOMPANY YOU HOME AND STAY WITH YOU FOR 24 HOURS.  Name and phone number of your driver:  Special Instructions: N/A              Please read over the following fact sheets you were given: _____________________________________________________________________             [facility] - Preparing for Surgery Before surgery, you can play an important role.  Because skin is not sterile, your skin needs to be as free of germs as possible.  You can reduce the number of germs on your skin by washing with CHG (chlorahexidine  gluconate) soap before surgery.  CHG is an antiseptic cleaner which kills germs and bonds with the skin to continue killing germs even after washing. Please DO NOT use if you  have an allergy to CHG or antibacterial soaps.  If your skin becomes reddened/irritated stop using the CHG and inform your nurse when you arrive at Short Stay. Do not shave (including legs and underarms) for at least 48 hours prior to the first CHG shower.  You may shave your face/neck. Please follow these instructions carefully:  1.  Shower with CHG Soap the night before surgery and the  morning of Surgery.  2.  If you choose to wash your hair, wash your hair first as usual with your  normal  shampoo.  3.  After you shampoo, rinse your hair and body thoroughly to remove the  shampoo.                           4.  Use CHG as you would any other liquid soap.  You can apply chg directly  to the skin and wash                       Gently with a scrungie or clean washcloth.  5.  Apply the CHG Soap to your body ONLY FROM THE NECK DOWN.   Do not use on face/ open                           Wound or open sores. Avoid contact with eyes, ears mouth and genitals (private parts).                       Wash face,  Genitals (private parts) with your normal soap.             6.  Wash thoroughly, paying special attention to the area where your surgery  will be performed.  7.  Thoroughly rinse your body with warm water from the neck down.  8.  DO NOT shower/wash with your normal soap after using and rinsing off  the CHG Soap.                9.  Pat yourself dry with a clean towel.            10.  Wear clean pajamas.            11.  Place clean sheets on your bed the night of your first shower and do not  sleep with pets. Day of Surgery : Do not apply any lotions/deodorants the morning of surgery.  Please wear clean clothes to the hospital/surgery center.  FAILURE TO FOLLOW THESE INSTRUCTIONS MAY RESULT IN THE CANCELLATION OF YOUR SURGERY PATIENT SIGNATURE_________________________________  NURSE  SIGNATURE__________________________________  ________________________________________________________________________   Rogelia MireIncentive Spirometer  An incentive spirometer is a tool that can help keep your lungs clear and active. This tool measures how well you are filling your lungs with each breath. Taking long deep breaths may help reverse or decrease the chance of developing breathing (pulmonary) problems (especially infection) following:  A long period of time when you are unable to move or be active. BEFORE THE PROCEDURE   If the spirometer includes an indicator to show your best effort, your nurse or respiratory therapist will set it to a desired goal.  If possible, sit up straight or lean slightly  forward. Try not to slouch.  Hold the incentive spirometer in an upright position. INSTRUCTIONS FOR USE  1. Sit on the edge of your bed if possible, or sit up as far as you can in bed or on a chair. 2. Hold the incentive spirometer in an upright position. 3. Breathe out normally. 4. Place the mouthpiece in your mouth and seal your lips tightly around it. 5. Breathe in slowly and as deeply as possible, raising the piston or the ball toward the top of the column. 6. Hold your breath for 3-5 seconds or for as long as possible. Allow the piston or ball to fall to the bottom of the column. 7. Remove the mouthpiece from your mouth and breathe out normally. 8. Rest for a few seconds and repeat Steps 1 through 7 at least 10 times every 1-2 hours when you are awake. Take your time and take a few normal breaths between deep breaths. 9. The spirometer may include an indicator to show your best effort. Use the indicator as a goal to work toward during each repetition. 10. After each set of 10 deep breaths, practice coughing to be sure your lungs are clear. If you have an incision (the cut made at the time of surgery), support your incision when coughing by placing a pillow or rolled up towels firmly  against it. Once you are able to get out of bed, walk around indoors and cough well. You may stop using the incentive spirometer when instructed by your caregiver.  RISKS AND COMPLICATIONS  Take your time so you do not get dizzy or light-headed.  If you are in pain, you may need to take or ask for pain medication before doing incentive spirometry. It is harder to take a deep breath if you are having pain. AFTER USE  Rest and breathe slowly and easily.  It can be helpful to keep track of a log of your progress. Your caregiver can provide you with a simple table to help with this. If you are using the spirometer at home, follow these instructions: SEEK MEDICAL CARE IF:   You are having difficultly using the spirometer.  You have trouble using the spirometer as often as instructed.  Your pain medication is not giving enough relief while using the spirometer.  You develop fever of 100.5 F (38.1 C) or higher. SEEK IMMEDIATE MEDICAL CARE IF:   You cough up bloody sputum that had not been present before.  You develop fever of 102 F (38.9 C) or greater.  You develop worsening pain at or near the incision site. MAKE SURE YOU:   Understand these instructions.  Will watch your condition.  Will get help right away if you are not doing well or get worse. Document Released: 07/24/2006 Document Revised: 06/05/2011 Document Reviewed: 09/24/2006 [employer] Patient Information 2014 [employer], [place].   ________________________________________________________________________  WHAT IS A BLOOD TRANSFUSION? Blood Transfusion Information  A transfusion is the replacement of blood or some of its parts. Blood is made up of multiple cells which provide different functions.  Red blood cells carry oxygen and are used for blood loss replacement.  White blood cells fight against infection.  Platelets control bleeding.  Plasma helps clot blood.  Other blood products are available for  specialized needs, such as hemophilia or other clotting disorders. BEFORE THE TRANSFUSION  Who gives blood for transfusions?   Healthy volunteers who are fully evaluated to make sure their blood is safe. This is blood bank blood. Transfusion therapy  is the safest it has ever been in the practice of medicine. Before blood is taken from a donor, a complete history is taken to make sure that person has no history of diseases nor engages in risky social behavior (examples are intravenous drug use or sexual activity with multiple partners). The donor's travel history is screened to minimize risk of transmitting infections, such as malaria. The donated blood is tested for signs of infectious diseases, such as HIV and hepatitis. The blood is then tested to be sure it is compatible with you in order to minimize the chance of a transfusion reaction. If you or a relative donates blood, this is often done in anticipation of surgery and is not appropriate for emergency situations. It takes many days to process the donated blood. RISKS AND COMPLICATIONS Although transfusion therapy is very safe and saves many lives, the main dangers of transfusion include:   Getting an infectious disease.  Developing a transfusion reaction. This is an allergic reaction to something in the blood you were given. Every precaution is taken to prevent this. The decision to have a blood transfusion has been considered carefully by your caregiver before blood is given. Blood is not given unless the benefits outweigh the risks. AFTER THE TRANSFUSION  Right after receiving a blood transfusion, you will usually feel much better and more energetic. This is especially true if your red blood cells have gotten low (anemic). The transfusion raises the level of the red blood cells which carry oxygen, and this usually causes an energy increase.  The nurse administering the transfusion will monitor you carefully for complications. HOME CARE  INSTRUCTIONS  No special instructions are needed after a transfusion. You may find your energy is better. Speak with your caregiver about any limitations on activity for underlying diseases you may have. SEEK MEDICAL CARE IF:   Your condition is not improving after your transfusion.  You develop redness or irritation at the intravenous (IV) site. SEEK IMMEDIATE MEDICAL CARE IF:  Any of the following symptoms occur over the next 12 hours:  Shaking chills.  You have a temperature by mouth above 102 F (38.9 C), not controlled by medicine.  Chest, back, or muscle pain.  People around you feel you are not acting correctly or are confused.  Shortness of breath or difficulty breathing.  Dizziness and fainting.  You get a rash or develop hives.  You have a decrease in urine output.  Your urine turns a dark color or changes to pink, red, or brown. Any of the following symptoms occur over the next 10 days:  You have a temperature by mouth above 102 F (38.9 C), not controlled by medicine.  Shortness of breath.  Weakness after normal activity.  The white part of the eye turns yellow (jaundice).  You have a decrease in the amount of urine or are urinating less often.  Your urine turns a dark color or changes to pink, red, or brown. Document Released: 03/10/2000 Document Revised: 06/05/2011 Document Reviewed: 10/28/2007 [facility] Patient Information 2014 [place], [place].  _______________________________________________________________________

## 2019-02-04 ENCOUNTER — Encounter (HOSPITAL_COMMUNITY): Payer: Self-pay

## 2019-02-04 ENCOUNTER — Ambulatory Visit (HOSPITAL_COMMUNITY)
Admission: RE | Admit: 2019-02-04 | Discharge: 2019-02-04 | Disposition: A | Discharge status: Home / Self Care | Payer: Medicare HMO | Source: Ambulatory Visit | Attending: Orthopedic Surgery | Admitting: Orthopedic Surgery

## 2019-02-04 ENCOUNTER — Other Ambulatory Visit: Payer: Self-pay

## 2019-02-04 ENCOUNTER — Other Ambulatory Visit (HOSPITAL_COMMUNITY)
Admission: RE | Admit: 2019-02-04 | Discharge: 2019-02-04 | Disposition: A | Discharge status: Home / Self Care | Payer: Medicare HMO | Source: Ambulatory Visit | Admitting: Orthopedic Surgery

## 2019-02-04 ENCOUNTER — Encounter (HOSPITAL_COMMUNITY)
Admission: RE | Admit: 2019-02-04 | Discharge: 2019-02-04 | Disposition: A | Discharge status: Home / Self Care | Payer: Medicare HMO | Source: Ambulatory Visit | Attending: Orthopedic Surgery | Admitting: Orthopedic Surgery

## 2019-02-04 DIAGNOSIS — Z20828 Contact with and (suspected) exposure to other viral communicable diseases: Secondary | ICD-10-CM | POA: Diagnosis not present

## 2019-02-04 DIAGNOSIS — I700 Atherosclerosis of aorta: Secondary | ICD-10-CM | POA: Insufficient documentation

## 2019-02-04 DIAGNOSIS — Z01811 Encounter for preprocedural respiratory examination: Secondary | ICD-10-CM

## 2019-02-04 DIAGNOSIS — Z01818 Encounter for other preprocedural examination: Secondary | ICD-10-CM | POA: Insufficient documentation

## 2019-02-04 HISTORY — DX: Essential (primary) hypertension: I10

## 2019-02-04 HISTORY — DX: Unspecified osteoarthritis, unspecified site: M19.90

## 2019-02-04 LAB — CBC WITH DIFFERENTIAL/PLATELET
Abs Immature Granulocytes: 0.03 10*3/uL (ref 0.00–0.07)
Basophils Absolute: 0.1 10*3/uL (ref 0.0–0.1)
Basophils Relative: 1 %
Eosinophils Absolute: 0.1 10*3/uL (ref 0.0–0.5)
Eosinophils Relative: 1 %
HCT: 52 % (ref 39.0–52.0)
Hemoglobin: 16.8 g/dL (ref 13.0–17.0)
Immature Granulocytes: 0 %
Lymphocytes Relative: 23 %
Lymphs Abs: 1.5 10*3/uL (ref 0.7–4.0)
MCH: 34.1 pg — ABNORMAL HIGH (ref 26.0–34.0)
MCHC: 32.3 g/dL (ref 30.0–36.0)
MCV: 105.5 fL — ABNORMAL HIGH (ref 80.0–100.0)
Monocytes Absolute: 0.8 10*3/uL (ref 0.1–1.0)
Monocytes Relative: 11 %
Neutro Abs: 4.3 10*3/uL (ref 1.7–7.7)
Neutrophils Relative %: 64 %
Platelets: 191 10*3/uL (ref 150–400)
RBC: 4.93 MIL/uL (ref 4.22–5.81)
RDW: 12.9 % (ref 11.5–15.5)
WBC: 6.7 10*3/uL (ref 4.0–10.5)
nRBC: 0 % (ref 0.0–0.2)

## 2019-02-04 LAB — URINALYSIS, ROUTINE W REFLEX MICROSCOPIC
Bilirubin Urine: NEGATIVE
Glucose, UA: NEGATIVE mg/dL
Hgb urine dipstick: NEGATIVE
Ketones, ur: 5 mg/dL — AB
Leukocytes,Ua: NEGATIVE
Nitrite: NEGATIVE
Protein, ur: NEGATIVE mg/dL
Specific Gravity, Urine: 1.012 (ref 1.005–1.030)
pH: 5 (ref 5.0–8.0)

## 2019-02-04 LAB — COMPREHENSIVE METABOLIC PANEL
ALT: 45 U/L — ABNORMAL HIGH (ref 0–44)
AST: 28 U/L (ref 15–41)
Albumin: 4.6 g/dL (ref 3.5–5.0)
Alkaline Phosphatase: 52 U/L (ref 38–126)
Anion gap: 9 (ref 5–15)
BUN: 24 mg/dL — ABNORMAL HIGH (ref 8–23)
CO2: 27 mmol/L (ref 22–32)
Calcium: 9.5 mg/dL (ref 8.9–10.3)
Chloride: 104 mmol/L (ref 98–111)
Creatinine, Ser: 1.06 mg/dL (ref 0.61–1.24)
GFR calc Af Amer: >60 mL/min (ref >60)
GFR calc non Af Amer: >60 mL/min (ref >60)
Glucose, Bld: 96 mg/dL (ref 70–99)
Potassium: 4.8 mmol/L (ref 3.5–5.1)
Sodium: 140 mmol/L (ref 135–145)
Total Bilirubin: 1.6 mg/dL — ABNORMAL HIGH (ref 0.3–1.2)
Total Protein: 7.3 g/dL (ref 6.5–8.1)

## 2019-02-04 LAB — APTT: aPTT: 25 seconds (ref 24–36)

## 2019-02-04 LAB — ABO/RH: ABO/RH(D): O POS

## 2019-02-04 LAB — PROTIME-INR
INR: 1 (ref 0.8–1.2)
Prothrombin Time: 12.7 seconds (ref 11.4–15.2)

## 2019-02-04 LAB — SURGICAL PCR SCREEN
MRSA, PCR: NEGATIVE
Staphylococcus aureus: POSITIVE — AB

## 2019-02-05 NOTE — Progress Notes (Signed)
PCR results routed to Dr. Berenice Primas for review

## 2019-02-06 LAB — NOVEL CORONAVIRUS, NAA (HOSP ORDER, SEND-OUT TO REF LAB; TAT 18-24 HRS): SARS-CoV-2, NAA: NOT DETECTED

## 2019-02-06 MED ORDER — BUPIVACAINE LIPOSOME 1.3 % IJ SUSP
20.0000 mL | Freq: Once | INTRAMUSCULAR | Status: DC
  Filled 2019-02-06: qty 20

## 2019-02-06 NOTE — Anesthesia Preprocedure Evaluation (Addendum)
Anesthesia Evaluation  Patient identified by MRN, date of birth, ID band Patient awake    Reviewed: Allergy & Precautions, NPO status , Patient's Chart, lab work & pertinent test results  History of Anesthesia Complications Negative for: history of anesthetic complications  Airway Mallampati: II  TM Distance: >3 FB Neck ROM: Full    Dental  (+) Teeth Intact   Pulmonary neg pulmonary ROS,    Pulmonary exam normal        Cardiovascular hypertension, Pt. on medications Normal cardiovascular exam     Neuro/Psych negative neurological ROS  negative psych ROS   GI/Hepatic negative GI ROS, Neg liver ROS,   Endo/Other  negative endocrine ROS  Renal/GU negative Renal ROS  negative genitourinary   Musculoskeletal  (+) Arthritis , Osteoarthritis,    Abdominal   Peds  Hematology negative hematology ROS (+)   Anesthesia Other Findings   Reproductive/Obstetrics                            Anesthesia Physical Anesthesia Plan  ASA: II  Anesthesia Plan: Spinal   Post-op Pain Management:    Induction:   PONV Risk Score and Plan: 1 and Propofol infusion, Treatment may vary due to age or medical condition, Ondansetron and TIVA  Airway Management Planned: Nasal Cannula and Simple Face Mask  Additional Equipment: None  Intra-op Plan:   Post-operative Plan:   Informed Consent: I have reviewed the patients History and Physical, chart, labs and discussed the procedure including the risks, benefits and alternatives for the proposed anesthesia with the patient or authorized representative who has indicated his/her understanding and acceptance.     Dental advisory given  Plan Discussed with:   Anesthesia Plan Comments:        Anesthesia Quick Evaluation

## 2019-02-06 NOTE — H&P (Signed)
TOTAL HIP ADMISSION H&P  Patient is admitted for left total hip arthroplasty.  Subjective:  Chief Complaint: left hip pain  HPI: Fernando Ayers, 63 y.o. male, has a history of pain and functional disability in the left hip(s) due to arthritis and patient has failed non-surgical conservative treatments for greater than 12 weeks to include NSAID's and/or analgesics, use of assistive devices and activity modification.  Onset of symptoms was gradual starting 3 years ago with gradually worsening course since that time.The patient noted no past surgery on the left hip(s).  Patient currently rates pain in the left hip at 9 out of 10 with activity. Patient has night pain, worsening of pain with activity and weight bearing, trendelenberg gait, pain that interfers with activities of daily living, pain with passive range of motion and joint swelling. Patient has evidence of subchondral cysts, subchondral sclerosis, periarticular osteophytes and joint space narrowing by imaging studies. This condition presents safety issues increasing the risk of falls. This patient has had Failure of all reasonable conservative care.  There is no current active infection.  Patient Active Problem List   Diagnosis Date Noted  . Benign essential hypertension 03/28/2018  . Left cervical radiculopathy 09/16/2015  . Primary osteoarthritis of left knee 04/28/2015  . Primary osteoarthritis of left hip 04/28/2015   Past Medical History:  Diagnosis Date  . Arthritis   . Hypertension     Past Surgical History:  Procedure Laterality Date  . NO PAST SURGERIES    . TONSILLECTOMY      Current Facility-Administered Medications  Medication Dose Route Frequency Provider Last Rate Last Dose  . [START ON 02/07/2019] bupivacaine liposome (EXPAREL) 1.3 % injection 266 mg  20 mL Other Once Dorna Leitz, MD       Current Outpatient Medications  Medication Sig Dispense Refill Last Dose  . acidophilus (RISAQUAD) CAPS capsule Take 2  capsules by mouth daily.     . Ginkgo Biloba 120 MG CAPS Take 120 mg by mouth daily.     . irbesartan-hydrochlorothiazide (AVALIDE) 300-12.5 MG tablet TAKE 1 TABLET BY MOUTH DAILY 30 tablet 3   . Lysine 500 MG TABS Take 500 mg by mouth daily.     Marland Kitchen MAGNESIUM GLYCINATE PO Take 2 capsules by mouth at bedtime.     . meclizine (ANTIVERT) 25 MG tablet Take 25 mg by mouth 3 (three) times daily as needed for dizziness.      . meloxicam (MOBIC) 15 MG tablet TAKE 1 TABLET BY MOUTH EVERY MORNING WITH BREAKFAST FOR 2 WEEKS, THEN 1 TABLET DAILY AS NEEDED FOR PAIN (Patient taking differently: Take 15 mg by mouth daily. ) 30 tablet 3   . Menaquinone-7 (VITAMIN K2 PO) Take 1 capsule by mouth daily.     . Misc Natural Products (CURCUMAX PRO PO) Take 2 capsules by mouth daily.     . Multiple Vitamins-Minerals (ANTIOXIDANT PO) Take 4 capsules by mouth daily.     . Multiple Vitamins-Minerals (ZINC) LOZG Take 2 lozenges by mouth daily.     . Nattokinase 100 MG CAPS Take 100 mg by mouth daily.     Marland Kitchen OVER THE COUNTER MEDICATION Take 2 tablets by mouth daily. Methylation Complete     . OVER THE COUNTER MEDICATION Take 3 capsules by mouth at bedtime. Brain Save Multi     . OVER THE COUNTER MEDICATION Take 2 tablets by mouth daily. ecoNugenics Pecta-Sol-C modified citrus pectin     . OVER THE COUNTER MEDICATION Take 1 capsule  by mouth daily. Blood Sugar Synergy     . OVER THE COUNTER MEDICATION Take 1 capsule by mouth daily. Circulation Synergy     . Saw Palmetto, Serenoa repens, (SAW PALMETTO PO) Take 1 capsule by mouth at bedtime.     . Testosterone 30 MG/ACT SOLN Place 2 Pump onto the skin daily.     Marland Kitchen. VITAMIN D PO Take 1 capsule by mouth daily.     . Vitamin Mixture (VITAMIN C) LIQD Place 1,000 mg under the tongue daily.      Allergies  Allergen Reactions  . Penicillins Rash    Did it involve swelling of the face/tongue/throat, SOB, or low BP? No Did it involve sudden or severe rash/hives, skin peeling, or any  reaction on the inside of your mouth or nose? No Did you need to seek medical attention at a hospital or doctor's office? No When did it last happen?childhood allergy If all above answers are "NO", may proceed with cephalosporin use.     Social History   Tobacco Use  . Smoking status: Never Smoker  . Smokeless tobacco: Never Used  Substance Use Topics  . Alcohol use: Yes    Alcohol/week: 2.0 - 3.0 standard drinks    Types: 2 - 3 Standard drinks or equivalent per week    Comment: weekly    No family history on file.   ROS ROS: I have reviewed the patient's review of systems thoroughly and there are no positive responses as relates to the HPI. Objective:  Physical Exam  Vital signs in last 24 hours:   Well-developed well-nourished patient in no acute distress. Alert and oriented x3 HEENT:within normal limits Cardiac: Regular rate and rhythm Pulmonary: Lungs clear to auscultation Abdomen: Soft and nontender.  Normal active bowel sounds  Musculoskeletal: (Left hip: Painful range of motion.  Limited range of motion.  Limited internal rotation.  Neurovascular intact distally. Labs: Recent Results (from the past 2160 hour(s))  APTT     Status: None   Collection Time: 02/04/19  2:15 PM  Result Value Ref Range   aPTT 25 24 - 36 seconds    Comment: Performed at [facility], [address] [place], [place] [address]  CBC WITH DIFFERENTIAL     Status: Abnormal   Collection Time: 02/04/19  2:15 PM  Result Value Ref Range   WBC 6.7 4.0 - 10.5 K/uL   RBC 4.93 4.22 - 5.81 MIL/uL   Hemoglobin 16.8 13.0 - 17.0 g/dL   HCT 57.852.0 46.939.0 - 62.952.0 %   MCV 105.5 (H) 80.0 - 100.0 fL   MCH 34.1 (H) 26.0 - 34.0 pg   MCHC 32.3 30.0 - 36.0 g/dL   RDW 52.812.9 41.311.5 - 24.415.5 %   Platelets 191 150 - 400 K/uL   nRBC 0.0 0.0 - 0.2 %   Neutrophils Relative % 64 %   Neutro Abs 4.3 1.7 - 7.7 K/uL   Lymphocytes Relative 23 %   Lymphs Abs 1.5 0.7 - 4.0 K/uL   Monocytes Relative 11  %   Monocytes Absolute 0.8 0.1 - 1.0 K/uL   Eosinophils Relative 1 %   Eosinophils Absolute 0.1 0.0 - 0.5 K/uL   Basophils Relative 1 %   Basophils Absolute 0.1 0.0 - 0.1 K/uL   Immature Granulocytes 0 %   Abs Immature Granulocytes 0.03 0.00 - 0.07 K/uL    Comment: Performed at [facility], [address] [place], [place] [address]  Comprehensive metabolic panel  Status: Abnormal   Collection Time: 02/04/19  2:15 PM  Result Value Ref Range   Sodium 140 135 - 145 mmol/L   Potassium 4.8 3.5 - 5.1 mmol/L   Chloride 104 98 - 111 mmol/L   CO2 27 22 - 32 mmol/L   Glucose, Bld 96 70 - 99 mg/dL   BUN 24 (H) 8 - 23 mg/dL   Creatinine, Ser 1.06 0.61 - 1.24 mg/dL   Calcium 9.5 8.9 - 26.9 mg/dL   Total Protein 7.3 6.5 - 8.1 g/dL   Albumin 4.6 3.5 - 5.0 g/dL   AST 28 15 - 41 U/L   ALT 45 (H) 0 - 44 U/L   Alkaline Phosphatase 52 38 - 126 U/L   Total Bilirubin 1.6 (H) 0.3 - 1.2 mg/dL   GFR calc non Af Amer >60 >60 mL/min   GFR calc Af Amer >60 >60 mL/min   Anion gap 9 5 - 15    Comment: Performed at [facility], [address], [place], [place] [address]  Protime-INR     Status: None   Collection Time: 02/04/19  2:15 PM  Result Value Ref Range   Prothrombin Time 12.7 11.4 - 15.2 seconds   INR 1.0 0.8 - 1.2    Comment: (NOTE) INR goal varies based on device and disease states. Performed at [facility], [address], [place], [place] [address]   Type and screen Order type and screen if day of surgery is less than 15 days from draw of preadmission visit or order morning of surgery if day of surgery is greater than 6 days from preadmission visit.     Status: None   Collection Time: 02/04/19  2:15 PM  Result Value Ref Range   ABO/RH(D) O POS    Antibody Screen NEG    Sample Expiration 02/18/2019,2359    Extend sample reason      NO TRANSFUSIONS OR PREGNANCY IN THE PAST 3 MONTHS Performed at [facility],  [address], [place], [place] [address]   Urinalysis, Routine w reflex microscopic     Status: Abnormal   Collection Time: 02/04/19  2:15 PM  Result Value Ref Range   Color, Urine YELLOW YELLOW   APPearance CLEAR CLEAR   Specific Gravity, Urine 1.012 1.005 - 1.030   pH 5.0 5.0 - 8.0   Glucose, UA NEGATIVE NEGATIVE mg/dL   Hgb urine dipstick NEGATIVE NEGATIVE   Bilirubin Urine NEGATIVE NEGATIVE   Ketones, ur 5 (A) NEGATIVE mg/dL   Protein, ur NEGATIVE NEGATIVE mg/dL   Nitrite NEGATIVE NEGATIVE   Leukocytes,Ua NEGATIVE NEGATIVE    Comment: Performed at [facility], [address] [place], [place] [address]  Surgical pcr screen     Status: Abnormal   Collection Time: 02/04/19  2:15 PM   Specimen: Vein; Nasal Swab  Result Value Ref Range   MRSA, PCR NEGATIVE NEGATIVE   Staphylococcus aureus POSITIVE (A) NEGATIVE    Comment: (NOTE) The Xpert SA Assay (FDA approved for NASAL specimens in patients 51 years of age and older), is one component of a comprehensive surveillance program. It is not intended to diagnose infection nor to guide or monitor treatment. Performed at [facility], [address] [place], [place] [address]   ABO/Rh     Status: None   Collection Time: 02/04/19  2:15 PM  Result Value Ref Range   ABO/RH(D)      O POS Performed at  [facility], [address], [place], [place] [address]   Novel Coronavirus, NAA [facility] order, Send-out to Ref Lab; TAT 18-24 hrs     Status: None   Collection Time: 02/04/19  2:59 PM   Specimen: Nasopharyngeal Swab; Respiratory  Result Value Ref Range   SARS-CoV-2, NAA NOT DETECTED NOT DETECTED    Comment: (NOTE) This nucleic acid amplification test was developed and its performance characteristics determined by [employer]. Nucleic acid amplification tests include PCR and TMA. This test has not been FDA cleared or approved. This test has been authorized by FDA  under an Emergency Use Authorization (EUA). This test is only authorized for the duration of time the declaration that circumstances exist justifying the authorization of the emergency use of in vitro diagnostic tests for detection of SARS-CoV-2 virus and/or diagnosis of COVID-19 infection under section 564(b)(1) of the Act, 21 U.S.C. 914NWG-9(F) (1), unless the authorization is terminated or revoked sooner. When diagnostic testing is negative, the possibility of a false negative result should be considered in the context of a patient's recent exposures and the presence of clinical signs and symptoms consistent with COVID-19. An individual without symptoms of COVID- 19 and who is not shedding SARS-CoV-2 vi rus would expect to have a negative (not detected) result in this assay. Performed At: [facility] [address] [place], [place] [id number] Jolene Schimke MD QI:[id number]    Coronavirus Source NASOPHARYNGEAL     Comment: Performed at [facility], [address], [place], [place] [address]    Estimated body mass index is 27.06 kg/m as calculated from the following:   Height as of 02/04/19:  (1.753 m).   Weight as of 02/04/19: 83.1 kg.   Imaging Review Plain radiographs demonstrate severe degenerative joint disease of the left hip(s). The bone quality appears to be fair for age and reported activity level.      Assessment/Plan:  End stage arthritis, left hip(s)  The patient history, physical examination, clinical judgement of the provider and imaging studies are consistent with end stage degenerative joint disease of the left hip(s) and total hip arthroplasty is deemed medically necessary. The treatment options including medical management, injection therapy, arthroscopy and arthroplasty were discussed at length. The risks and benefits of total hip arthroplasty were presented and reviewed. The risks due to aseptic loosening, infection, stiffness,  dislocation/subluxation,  thromboembolic complications and other imponderables were discussed.  The patient acknowledged the explanation, agreed to proceed with the plan and consent was signed. Patient is being admitted for inpatient treatment for surgery, pain control, PT, OT, prophylactic antibiotics, VTE prophylaxis, progressive ambulation and ADL's and discharge planning.The patient is planning to be discharged home with home health services

## 2019-02-07 ENCOUNTER — Encounter (HOSPITAL_COMMUNITY): Payer: Self-pay | Admitting: *Deleted

## 2019-02-07 ENCOUNTER — Ambulatory Visit (HOSPITAL_COMMUNITY): Payer: Medicare HMO

## 2019-02-07 ENCOUNTER — Encounter (HOSPITAL_COMMUNITY)
Admission: RE | Disposition: A | Discharge status: Home Health | Payer: Self-pay | Source: Other Acute Inpatient Hospital | Attending: Orthopedic Surgery

## 2019-02-07 ENCOUNTER — Other Ambulatory Visit: Payer: Self-pay

## 2019-02-07 ENCOUNTER — Ambulatory Visit (HOSPITAL_COMMUNITY)
Admission: RE | Admit: 2019-02-07 | Discharge: 2019-02-08 | Disposition: A | Discharge status: Home Health | Payer: Medicare HMO | Source: Other Acute Inpatient Hospital | Attending: Orthopedic Surgery | Admitting: Orthopedic Surgery

## 2019-02-07 ENCOUNTER — Ambulatory Visit (HOSPITAL_COMMUNITY): Payer: Medicare HMO | Admitting: Physician Assistant

## 2019-02-07 ENCOUNTER — Ambulatory Visit (HOSPITAL_COMMUNITY): Payer: Medicare HMO | Admitting: Certified Registered"

## 2019-02-07 DIAGNOSIS — Z791 Long term (current) use of non-steroidal anti-inflammatories (NSAID): Secondary | ICD-10-CM | POA: Insufficient documentation

## 2019-02-07 DIAGNOSIS — I10 Essential (primary) hypertension: Secondary | ICD-10-CM | POA: Diagnosis not present

## 2019-02-07 DIAGNOSIS — Z419 Encounter for procedure for purposes other than remedying health state, unspecified: Secondary | ICD-10-CM

## 2019-02-07 DIAGNOSIS — Z96642 Presence of left artificial hip joint: Secondary | ICD-10-CM | POA: Diagnosis present

## 2019-02-07 DIAGNOSIS — M1612 Unilateral primary osteoarthritis, left hip: Secondary | ICD-10-CM

## 2019-02-07 DIAGNOSIS — Z79899 Other long term (current) drug therapy: Secondary | ICD-10-CM | POA: Insufficient documentation

## 2019-02-07 HISTORY — PX: TOTAL HIP ARTHROPLASTY: SHX124

## 2019-02-07 LAB — TYPE AND SCREEN
ABO/RH(D): O POS
Antibody Screen: NEGATIVE

## 2019-02-07 SURGERY — ARTHROPLASTY, HIP, TOTAL, ANTERIOR APPROACH
Anesthesia: Spinal | Site: Hip | Laterality: Left

## 2019-02-07 MED ORDER — POLYETHYLENE GLYCOL 3350 17 G PO PACK: 17.0000 g | PACK | Freq: Every day | ORAL | Status: DC | PRN

## 2019-02-07 MED ORDER — GABAPENTIN 300 MG PO CAPS
300.0000 mg | ORAL_CAPSULE | Freq: Two times a day (BID) | ORAL | Status: DC
  Administered 2019-02-07 – 2019-02-08 (×2): 300 mg via ORAL
  Filled 2019-02-07 (×2): qty 1

## 2019-02-07 MED ORDER — OXYCODONE HCL 5 MG/5ML PO SOLN: 5.0000 mg | Freq: Once | ORAL | Status: DC | PRN

## 2019-02-07 MED ORDER — PROPOFOL 10 MG/ML IV BOLUS
INTRAVENOUS | Status: AC
  Filled 2019-02-07: qty 20

## 2019-02-07 MED ORDER — IRBESARTAN-HYDROCHLOROTHIAZIDE 300-12.5 MG PO TABS: 1.0000 | ORAL_TABLET | Freq: Every day | ORAL | Status: DC

## 2019-02-07 MED ORDER — DOCUSATE SODIUM 100 MG PO CAPS
100.0000 mg | ORAL_CAPSULE | Freq: Two times a day (BID) | ORAL | Status: DC
  Administered 2019-02-07 – 2019-02-08 (×2): 100 mg via ORAL
  Filled 2019-02-07 (×2): qty 1

## 2019-02-07 MED ORDER — BISACODYL 5 MG PO TBEC: 5.0000 mg | DELAYED_RELEASE_TABLET | Freq: Every day | ORAL | Status: DC | PRN

## 2019-02-07 MED ORDER — EPHEDRINE SULFATE-NACL 50-0.9 MG/10ML-% IV SOSY
PREFILLED_SYRINGE | INTRAVENOUS | Status: DC | PRN
  Administered 2019-02-07 (×3): 5 mg via INTRAVENOUS

## 2019-02-07 MED ORDER — OXYCODONE-ACETAMINOPHEN 5-325 MG PO TABS: 1.0000 | ORAL_TABLET | Freq: Four times a day (QID) | ORAL | 0 refills | Status: AC | PRN

## 2019-02-07 MED ORDER — DIPHENHYDRAMINE HCL 12.5 MG/5ML PO ELIX: 12.5000 mg | ORAL_SOLUTION | ORAL | Status: DC | PRN

## 2019-02-07 MED ORDER — OXYCODONE HCL 5 MG PO TABS
5.0000 mg | ORAL_TABLET | ORAL | Status: DC | PRN
  Administered 2019-02-07: 5 mg via ORAL
  Administered 2019-02-07: 10 mg via ORAL
  Administered 2019-02-07: 5 mg via ORAL
  Administered 2019-02-07 – 2019-02-08 (×3): 10 mg via ORAL
  Filled 2019-02-07: qty 1
  Filled 2019-02-07 (×3): qty 2
  Filled 2019-02-07: qty 1
  Filled 2019-02-07: qty 2

## 2019-02-07 MED ORDER — ASPIRIN EC 325 MG PO TBEC: 325.0000 mg | DELAYED_RELEASE_TABLET | Freq: Two times a day (BID) | ORAL | 0 refills | Status: AC

## 2019-02-07 MED ORDER — METHOCARBAMOL 500 MG PO TABS
500.0000 mg | ORAL_TABLET | Freq: Four times a day (QID) | ORAL | Status: DC | PRN
  Administered 2019-02-07 – 2019-02-08 (×2): 500 mg via ORAL
  Filled 2019-02-07 (×2): qty 1

## 2019-02-07 MED ORDER — HYDROCHLOROTHIAZIDE 12.5 MG PO CAPS
12.5000 mg | ORAL_CAPSULE | Freq: Every day | ORAL | Status: DC
  Administered 2019-02-07 – 2019-02-08 (×2): 12.5 mg via ORAL
  Filled 2019-02-07 (×2): qty 1

## 2019-02-07 MED ORDER — CELECOXIB 200 MG PO CAPS
200.0000 mg | ORAL_CAPSULE | Freq: Two times a day (BID) | ORAL | Status: DC
  Administered 2019-02-07 – 2019-02-08 (×2): 200 mg via ORAL
  Filled 2019-02-07 (×2): qty 1

## 2019-02-07 MED ORDER — METHOCARBAMOL 500 MG IVPB - SIMPLE MED
500.0000 mg | Freq: Four times a day (QID) | INTRAVENOUS | Status: DC | PRN
  Administered 2019-02-07: 500 mg via INTRAVENOUS
  Filled 2019-02-07: qty 50

## 2019-02-07 MED ORDER — PROPOFOL 10 MG/ML IV BOLUS
INTRAVENOUS | Status: DC | PRN
  Administered 2019-02-07: 20 mg via INTRAVENOUS

## 2019-02-07 MED ORDER — VANCOMYCIN HCL IN DEXTROSE 1-5 GM/200ML-% IV SOLN
1000.0000 mg | INTRAVENOUS | Status: AC
  Administered 2019-02-07: 1000 mg via INTRAVENOUS
  Filled 2019-02-07: qty 200

## 2019-02-07 MED ORDER — DEXAMETHASONE SODIUM PHOSPHATE 10 MG/ML IJ SOLN
INTRAMUSCULAR | Status: AC
  Filled 2019-02-07: qty 1

## 2019-02-07 MED ORDER — PROPOFOL 500 MG/50ML IV EMUL
INTRAVENOUS | Status: DC | PRN
  Administered 2019-02-07: 50 ug/kg/min via INTRAVENOUS

## 2019-02-07 MED ORDER — WATER FOR IRRIGATION, STERILE IR SOLN
Status: DC | PRN
  Administered 2019-02-07 (×2): 1000 mL

## 2019-02-07 MED ORDER — ACETAMINOPHEN 325 MG PO TABS: 325.0000 mg | ORAL_TABLET | Freq: Four times a day (QID) | ORAL | Status: DC | PRN

## 2019-02-07 MED ORDER — CHLORHEXIDINE GLUCONATE 4 % EX LIQD: 60.0000 mL | Freq: Once | CUTANEOUS | Status: DC

## 2019-02-07 MED ORDER — FENTANYL CITRATE (PF) 100 MCG/2ML IJ SOLN
INTRAMUSCULAR | Status: AC
  Filled 2019-02-07: qty 2

## 2019-02-07 MED ORDER — BUPIVACAINE IN DEXTROSE 0.75-8.25 % IT SOLN
INTRATHECAL | Status: DC | PRN
  Administered 2019-02-07: 2 mL via INTRATHECAL

## 2019-02-07 MED ORDER — TRANEXAMIC ACID-NACL 1000-0.7 MG/100ML-% IV SOLN
1000.0000 mg | INTRAVENOUS | Status: AC
  Administered 2019-02-07: 08:00:00 1000 mg via INTRAVENOUS
  Filled 2019-02-07: qty 100

## 2019-02-07 MED ORDER — SODIUM CHLORIDE 0.9 % IV SOLN
INTRAVENOUS | Status: DC
  Administered 2019-02-07: 1000 mL via INTRAVENOUS

## 2019-02-07 MED ORDER — ALUM & MAG HYDROXIDE-SIMETH 200-200-20 MG/5ML PO SUSP: 30.0000 mL | ORAL | Status: DC | PRN

## 2019-02-07 MED ORDER — DEXAMETHASONE SODIUM PHOSPHATE 10 MG/ML IJ SOLN
10.0000 mg | Freq: Two times a day (BID) | INTRAMUSCULAR | Status: DC
  Administered 2019-02-08: 10 mg via INTRAVENOUS
  Filled 2019-02-07: qty 1

## 2019-02-07 MED ORDER — IRBESARTAN 150 MG PO TABS
300.0000 mg | ORAL_TABLET | Freq: Every day | ORAL | Status: DC
  Administered 2019-02-07 – 2019-02-08 (×2): 300 mg via ORAL
  Filled 2019-02-07 (×2): qty 2

## 2019-02-07 MED ORDER — PROPOFOL 500 MG/50ML IV EMUL
INTRAVENOUS | Status: AC
  Filled 2019-02-07: qty 50

## 2019-02-07 MED ORDER — LACTATED RINGERS IV SOLN
INTRAVENOUS | Status: DC
  Administered 2019-02-07 (×2): via INTRAVENOUS

## 2019-02-07 MED ORDER — MIDAZOLAM HCL 2 MG/2ML IJ SOLN
INTRAMUSCULAR | Status: DC | PRN
  Administered 2019-02-07: 2 mg via INTRAVENOUS

## 2019-02-07 MED ORDER — BUPIVACAINE-EPINEPHRINE 0.25% -1:200000 IJ SOLN
INTRAMUSCULAR | Status: AC
  Filled 2019-02-07: qty 1

## 2019-02-07 MED ORDER — TIZANIDINE HCL 2 MG PO TABS: 2.0000 mg | ORAL_TABLET | Freq: Three times a day (TID) | ORAL | 0 refills | Status: AC | PRN

## 2019-02-07 MED ORDER — DEXAMETHASONE SODIUM PHOSPHATE 10 MG/ML IJ SOLN
INTRAMUSCULAR | Status: DC | PRN
  Administered 2019-02-07: 8 mg via INTRAVENOUS

## 2019-02-07 MED ORDER — MIDAZOLAM HCL 2 MG/2ML IJ SOLN
INTRAMUSCULAR | Status: AC
  Filled 2019-02-07: qty 2

## 2019-02-07 MED ORDER — ONDANSETRON HCL 4 MG/2ML IJ SOLN
INTRAMUSCULAR | Status: AC
  Filled 2019-02-07: qty 2

## 2019-02-07 MED ORDER — BUPIVACAINE LIPOSOME 1.3 % IJ SUSP
10.0000 mL | Freq: Once | INTRAMUSCULAR | Status: DC
  Filled 2019-02-07: qty 10

## 2019-02-07 MED ORDER — ONDANSETRON HCL 4 MG PO TABS: 4.0000 mg | ORAL_TABLET | Freq: Four times a day (QID) | ORAL | Status: DC | PRN

## 2019-02-07 MED ORDER — ONDANSETRON HCL 4 MG/2ML IJ SOLN
INTRAMUSCULAR | Status: DC | PRN
  Administered 2019-02-07: 4 mg via INTRAVENOUS

## 2019-02-07 MED ORDER — DOCUSATE SODIUM 100 MG PO CAPS: 100.0000 mg | ORAL_CAPSULE | Freq: Two times a day (BID) | ORAL | 0 refills | Status: AC

## 2019-02-07 MED ORDER — TRANEXAMIC ACID-NACL 1000-0.7 MG/100ML-% IV SOLN
1000.0000 mg | Freq: Once | INTRAVENOUS | Status: AC
  Administered 2019-02-07: 1000 mg via INTRAVENOUS
  Filled 2019-02-07: qty 100

## 2019-02-07 MED ORDER — METHOCARBAMOL 500 MG IVPB - SIMPLE MED
INTRAVENOUS | Status: AC
  Filled 2019-02-07: qty 50

## 2019-02-07 MED ORDER — FENTANYL CITRATE (PF) 100 MCG/2ML IJ SOLN
INTRAMUSCULAR | Status: DC | PRN
  Administered 2019-02-07: 25 ug via INTRAVENOUS
  Administered 2019-02-07: 50 ug via INTRAVENOUS

## 2019-02-07 MED ORDER — POVIDONE-IODINE 10 % EX SWAB
2.0000 "application " | Freq: Once | CUTANEOUS | Status: AC
  Administered 2019-02-07: 2 via TOPICAL

## 2019-02-07 MED ORDER — BUPIVACAINE LIPOSOME 1.3 % IJ SUSP
INTRAMUSCULAR | Status: DC | PRN
  Administered 2019-02-07: 10 mL

## 2019-02-07 MED ORDER — ONDANSETRON HCL 4 MG/2ML IJ SOLN: 4.0000 mg | Freq: Four times a day (QID) | INTRAMUSCULAR | Status: DC | PRN

## 2019-02-07 MED ORDER — FENTANYL CITRATE (PF) 100 MCG/2ML IJ SOLN
25.0000 ug | INTRAMUSCULAR | Status: DC | PRN
  Administered 2019-02-07: 50 ug via INTRAVENOUS

## 2019-02-07 MED ORDER — OXYCODONE HCL 5 MG PO TABS: 5.0000 mg | ORAL_TABLET | Freq: Once | ORAL | Status: DC | PRN

## 2019-02-07 MED ORDER — LIDOCAINE 2% (20 MG/ML) 5 ML SYRINGE
INTRAMUSCULAR | Status: AC
  Filled 2019-02-07: qty 5

## 2019-02-07 MED ORDER — VANCOMYCIN HCL IN DEXTROSE 1-5 GM/200ML-% IV SOLN
1000.0000 mg | Freq: Two times a day (BID) | INTRAVENOUS | Status: AC
  Administered 2019-02-07: 1000 mg via INTRAVENOUS
  Filled 2019-02-07: qty 200

## 2019-02-07 MED ORDER — ASPIRIN EC 325 MG PO TBEC
325.0000 mg | DELAYED_RELEASE_TABLET | Freq: Two times a day (BID) | ORAL | Status: DC
  Administered 2019-02-08: 325 mg via ORAL
  Filled 2019-02-07: qty 1

## 2019-02-07 MED ORDER — BUPIVACAINE-EPINEPHRINE 0.25% -1:200000 IJ SOLN
INTRAMUSCULAR | Status: DC | PRN
  Administered 2019-02-07: 30 mL

## 2019-02-07 MED ORDER — HYDROMORPHONE HCL 1 MG/ML IJ SOLN: 0.5000 mg | INTRAMUSCULAR | Status: DC | PRN

## 2019-02-07 MED ORDER — ONDANSETRON HCL 4 MG/2ML IJ SOLN: 4.0000 mg | Freq: Once | INTRAMUSCULAR | Status: DC | PRN

## 2019-02-07 MED ORDER — SODIUM CHLORIDE 0.9 % IR SOLN
Status: DC | PRN
  Administered 2019-02-07: 1000 mL

## 2019-02-07 MED ORDER — MAGNESIUM CITRATE PO SOLN: 1.0000 | Freq: Once | ORAL | Status: DC | PRN

## 2019-02-07 SURGICAL SUPPLY — 39 items
BAG ZIPLOCK 12X15 (MISCELLANEOUS) IMPLANT
BENZOIN TINCTURE PRP APPL 2/3 (GAUZE/BANDAGES/DRESSINGS) ×2 IMPLANT
BLADE SAW SGTL 18X1.27X75 (BLADE) ×2 IMPLANT
BLADE SURG SZ10 CARB STEEL (BLADE) ×4 IMPLANT
COVER PERINEAL POST (MISCELLANEOUS) ×2 IMPLANT
COVER SURGICAL LIGHT HANDLE (MISCELLANEOUS) ×2 IMPLANT
COVER WAND RF STERILE (DRAPES) IMPLANT
DECANTER SPIKE VIAL GLASS SM (MISCELLANEOUS) ×2 IMPLANT
DRAPE STERI IOBAN 125X83 (DRAPES) ×2 IMPLANT
DRAPE U-SHAPE 47X51 STRL (DRAPES) ×4 IMPLANT
DRSG AQUACEL AG ADV 3.5X 6 (GAUZE/BANDAGES/DRESSINGS) ×2 IMPLANT
DURAPREP 26ML APPLICATOR (WOUND CARE) ×2 IMPLANT
ELECT BLADE TIP CTD 4 INCH (ELECTRODE) ×2 IMPLANT
ELECT REM PT RETURN 15FT ADLT (MISCELLANEOUS) ×2 IMPLANT
ELIMINATOR HOLE APEX DEPUY (Hips) ×2 IMPLANT
GAUZE XEROFORM 1X8 LF (GAUZE/BANDAGES/DRESSINGS) IMPLANT
GLOVE BIOGEL PI IND STRL 8 (GLOVE) ×2 IMPLANT
GLOVE BIOGEL PI INDICATOR 8 (GLOVE) ×2
GLOVE ECLIPSE 7.5 STRL STRAW (GLOVE) ×4 IMPLANT
GOWN STRL REUS W/TWL XL LVL3 (GOWN DISPOSABLE) ×4 IMPLANT
HEAD CERAMIC 36 PLUS 8.5 12 14 (Hips) ×2 IMPLANT
HOLDER FOLEY CATH W/STRAP (MISCELLANEOUS) ×2 IMPLANT
HOOD PEEL AWAY FLYTE STAYCOOL (MISCELLANEOUS) ×4 IMPLANT
KIT TURNOVER KIT A (KITS) IMPLANT
LINER NEUTRAL 52MMX36MMX56N (Liner) ×2 IMPLANT
NEEDLE HYPO 22GX1.5 SAFETY (NEEDLE) ×2 IMPLANT
PACK ANTERIOR HIP CUSTOM (KITS) ×2 IMPLANT
PIN SECT CUP 56MM (Hips) ×2 IMPLANT
STAPLER VISISTAT 35W (STAPLE) IMPLANT
STEM CORAIL KA13 (Stem) ×2 IMPLANT
STRIP CLOSURE SKIN 1/2X4 (GAUZE/BANDAGES/DRESSINGS) ×2 IMPLANT
SUT ETHIBOND NAB CT1 #1 30IN (SUTURE) ×4 IMPLANT
SUT MNCRL AB 3-0 PS2 18 (SUTURE) IMPLANT
SUT VIC AB 0 CT1 36 (SUTURE) ×2 IMPLANT
SUT VIC AB 1 CT1 36 (SUTURE) ×2 IMPLANT
SUT VIC AB 2-0 CT1 27 (SUTURE) ×1
SUT VIC AB 2-0 CT1 TAPERPNT 27 (SUTURE) ×1 IMPLANT
TRAY FOLEY MTR SLVR 16FR STAT (SET/KITS/TRAYS/PACK) ×2 IMPLANT
YANKAUER SUCT BULB TIP 10FT TU (MISCELLANEOUS) ×2 IMPLANT

## 2019-02-07 NOTE — Anesthesia Procedure Notes (Signed)
Procedure Name: MAC Date/Time: 02/07/2019 7:28 AM Performed by: Eben Burow, CRNA Pre-anesthesia Checklist: Patient identified, Emergency Drugs available, Suction available, Patient being monitored and Timeout performed Oxygen Delivery Method: Simple face mask Dental Injury: Teeth and Oropharynx as per pre-operative assessment

## 2019-02-07 NOTE — Transfer of Care (Signed)
Immediate Anesthesia Transfer of Care Note  Patient: Fernando Ayers  Procedure(s) Performed: TOTAL HIP ARTHROPLASTY ANTERIOR APPROACH (Left Hip)  Patient Location: PACU  Anesthesia Type:Spinal  Level of Consciousness: awake, alert  and oriented  Airway & Oxygen Therapy: Patient Spontanous Breathing and Patient connected to face mask oxygen  Post-op Assessment: Report given to RN and Post -op Vital signs reviewed and stable  Post vital signs: Reviewed and stable  Last Vitals:  Vitals Value Taken Time  BP 127/74 02/07/19 0928  Temp    Pulse 83 02/07/19 0929  Resp 17 02/07/19 0929  SpO2 100 % 02/07/19 0929  Vitals shown include unvalidated device data.  Last Pain:  Vitals:   02/07/19 0617  TempSrc:   PainSc: 0-No pain      Patients Stated Pain Goal: 3 (78/93/81 0175)  Complications: No apparent anesthesia complications

## 2019-02-07 NOTE — Interval H&P Note (Signed)
History and Physical Interval Note:  02/07/2019 7:23 AM  Fernando Ayers  has presented today for surgery, with the diagnosis of Handley.  The various methods of treatment have been discussed with the patient and family. After consideration of risks, benefits and other options for treatment, the patient has consented to  Procedure(s): TOTAL HIP ARTHROPLASTY ANTERIOR APPROACH (Left) as a surgical intervention.  The patient's history has been reviewed, patient examined, no change in status, stable for surgery.  I have reviewed the patient's chart and labs.  Questions were answered to the patient's satisfaction.     [name]

## 2019-02-07 NOTE — Evaluation (Signed)
Physical Therapy Evaluation Patient Details Name: Fernando Ayers MRN: [id number] DOB: [birth year] Today's Date: 02/07/2019   History of Present Illness  63 yo male s/p L DA THA  Clinical Impression  Pt is s/p THA resulting in the deficits listed below (see PT Problem List).  Pt amb 14' with RW and min/guard. Anticipate steady progress in acute setting  Pt will benefit from skilled PT to increase their independence and safety with mobility to allow discharge to the venue listed below.      Follow Up Recommendations Follow surgeon's recommendation for DC plan and follow-up therapies    Equipment Recommendations  None recommended by PT    Recommendations for Other Services       Precautions / Restrictions Restrictions Weight Bearing Restrictions: No Other Position/Activity Restrictions: WBAT      Mobility  Bed Mobility Overal bed mobility: Needs Assistance Bed Mobility: Supine to Sit     Supine to sit: Min guard;Supervision     General bed mobility comments: for safety  Transfers Overall transfer level: Needs assistance Equipment used: Rolling walker (2 wheeled) Transfers: Sit to/from Stand Sit to Stand: Min guard         General transfer comment: cues for hand placement  Ambulation/Gait Ambulation/Gait assistance: Min guard;Min assist  80' Assistive device: Rolling walker (2 wheeled) Gait Pattern/deviations: Step-to pattern;Decreased weight shift to left     General Gait Details: cues for sequence and RW position  Stairs            Wheelchair Mobility    Modified Rankin (Stroke Patients Only)       Balance                                             Pertinent Vitals/Pain Pain Assessment: 0-10 Pain Score: 4  Pain Location: left hip Pain Descriptors / Indicators: Guarding;Sore Pain Intervention(s): Premedicated before session;Monitored during session;Limited activity within patient's tolerance;Repositioned;Ice applied     Home Living Family/patient expects to be discharged to:: Private residence Living Arrangements: Spouse/significant other Available Help at Discharge: Family;Available PRN/intermittently Type of Home: House Home Access: Stairs to enter   Entrance Stairs-Number of Steps: 2 Home Layout: Two level Home Equipment: Walker - 2 wheels;Toilet riser      Prior Function Level of Independence: Independent               Hand Dominance        Extremity/Trunk Assessment        Lower Extremity Assessment Lower Extremity Assessment: LLE deficits/detail LLE Deficits / Details: ankle WFL. knee and hip grossly 2+/5       Communication   Communication: No difficulties  Cognition Arousal/Alertness: Awake/alert Behavior During Therapy: WFL for tasks assessed/performed Overall Cognitive Status: Within Functional Limits for tasks assessed                                        General Comments      Exercises Total Joint Exercises Ankle Circles/Pumps: AROM;Both;10 reps Quad Sets: 5 reps;Both;AROM Heel Slides: AROM;Left;5 reps   Assessment/Plan    PT Assessment Patient needs continued PT services  PT Problem List Decreased strength;Decreased range of motion;Decreased mobility;Pain;Decreased activity tolerance;Decreased knowledge of use of DME       PT Treatment Interventions DME instruction;Therapeutic  exercise;Functional mobility training;Therapeutic activities;Patient/family education;Gait training;Stair training    PT Goals (Current goals can be found in the Care Plan section)  Acute Rehab PT Goals PT Goal Formulation: With patient Time For Goal Achievement: 02/14/19    Frequency 7X/week   Barriers to discharge        Co-evaluation               AM-PAC PT "6 Clicks" Mobility  Outcome Measure Help needed turning from your back to your side while in a flat bed without using bedrails?: A Little Help needed moving from lying on your back to  sitting on the side of a flat bed without using bedrails?: A Little Help needed moving to and from a bed to a chair (including a wheelchair)?: A Little Help needed standing up from a chair using your arms (e.g., wheelchair or bedside chair)?: A Little Help needed to walk in hospital room?: A Little Help needed climbing 3-5 steps with a railing? : A Little 6 Click Score: 18    End of Session Equipment Utilized During Treatment: Gait belt Activity Tolerance: Patient tolerated treatment well Patient left: with call bell/phone within reach;in chair;with family/visitor present Nurse Communication: Mobility status PT Visit Diagnosis: Difficulty in walking, not elsewhere classified (R26.2)    Time: 3419-3790 PT Time Calculation (min) (ACUTE ONLY): 22 min   Charges:   PT Evaluation $PT Eval Low Complexity: 1 Low          Kenyon Ana, PT  Pager: [phone] Acute Rehab Dept [facility]): [phone]   02/07/2019   [facility] 02/07/2019, 2:30 PM

## 2019-02-07 NOTE — Brief Op Note (Signed)
02/07/2019  5:07 PM  PATIENT:  Fernando Ayers  63 y.o. male  PRE-OPERATIVE DIAGNOSIS:  DEGENERATIVE JOINT DISEASE LEFT HIP  POST-OPERATIVE DIAGNOSIS:  DEGENERATIVE JOINT DISEASE LEFT HIP  PROCEDURE:  Procedure(s): TOTAL HIP ARTHROPLASTY ANTERIOR APPROACH (Left)  SURGEON:  Surgeon(s) and Role:    Dorna Leitz, MD - Primary  PHYSICIAN ASSISTANT:   ASSISTANTS: Gaspar Skeeters PA-C   ANESTHESIA:   spinal  EBL:  300 mL   BLOOD ADMINISTERED:none  DRAINS: none   LOCAL MEDICATIONS USED:  MARCAINE    and OTHER experel  SPECIMEN:  No Specimen  DISPOSITION OF SPECIMEN:  N/A  COUNTS:  YES  TOURNIQUET:  * No tourniquets in log *  DICTATION: .Other Dictation: Dictation Number [id number]  PLAN OF CARE: Admit for overnight observation  PATIENT DISPOSITION:  PACU - hemodynamically stable.   Delay start of Pharmacological VTE agent (>24hrs) due to surgical blood loss or risk of bleeding: no

## 2019-02-07 NOTE — Discharge Instructions (Signed)

## 2019-02-07 NOTE — Anesthesia Postprocedure Evaluation (Signed)
Anesthesia Post Note  Patient: Fernando Ayers  Procedure(s) Performed: TOTAL HIP ARTHROPLASTY ANTERIOR APPROACH (Left Hip)     Patient location during evaluation: PACU Anesthesia Type: Spinal Level of consciousness: oriented and awake and alert Pain management: pain level controlled Vital Signs Assessment: post-procedure vital signs reviewed and stable Respiratory status: spontaneous breathing, respiratory function stable and nonlabored ventilation Cardiovascular status: blood pressure returned to baseline and stable Postop Assessment: no headache, no backache, no apparent nausea or vomiting and spinal receding Anesthetic complications: no    Last Vitals:  Vitals:   02/07/19 1045 02/07/19 1100  BP: (!) 162/105   Pulse: 85 97  Resp: (!) 21 14  Temp: 36.9 C   SpO2: 100% 100%    Last Pain:  Vitals:   02/07/19 1100  TempSrc:   PainSc: 3             L Sensory Level: S1-Sole of foot, small toes (02/07/19 1100) R Sensory Level: S1-Sole of foot, small toes (02/07/19 1100)  Lidia Collum

## 2019-02-07 NOTE — Anesthesia Procedure Notes (Signed)
Spinal  Patient location during procedure: OR Staffing Anesthesiologist: Gerren Hoffmeier E, MD Performed: anesthesiologist  Preanesthetic Checklist Completed: patient identified, surgical consent, pre-op evaluation, timeout performed, IV checked, risks and benefits discussed and monitors and equipment checked Spinal Block Patient position: sitting Prep: site prepped and draped and DuraPrep Patient monitoring: continuous pulse ox, blood pressure and heart rate Approach: midline Location: L3-4 Injection technique: single-shot Needle Needle type: Pencan  Needle gauge: 24 G Needle length: 9 cm Additional Notes Functioning IV was confirmed and monitors were applied. Sterile prep and drape, including hand hygiene and sterile gloves were used. The patient was positioned and the spine was prepped. The skin was anesthetized with lidocaine.  Free flow of clear CSF was obtained prior to injecting local anesthetic into the CSF. The needle was carefully withdrawn. The patient tolerated the procedure well.      

## 2019-02-07 NOTE — Op Note (Signed)
NAME: Fernando Ayers, [place] MEDICAL RECORD [id number] ACCOUNT [id number] DATE OF BIRTH:04-Sep-1955 FACILITY: [facility] LOCATION: [facility] PHYSICIAN:Jeorgia Helming L. 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  PROCEDURE: 1.  Left total hip replacement with a Corail stem size 13 with a 56 mm porous coated Pinnacle cup, a neutral liner accepting a 36 mm head ball and a +8.5 mm, 36 mm delta ceramic head ball. 2.  Interpretation of multiple intraoperative fluoroscopic images.  SURGEON:  Jodi Geralds, MD  ASSISTANT:  Gus Puma PA-C, was present for the entire case and assisted by manipulation of the leg, manipulation of tissues, and closing to minimize OR time.  BRIEF HISTORY:  The patient is a 63 year old male with a long history of significant complaints of left hip pain.  He had been treated conservatively for a prolonged period of time.  He had x-rays showing bone-on-bone change.  He had failed conservative  care.  He was having night pain and light activity pain.  Because of failure of all conservative care, he was taken to the operating room for left total hip replacement.  DESCRIPTION OF PROCEDURE:  The patient was taken to the operating room after adequate anesthesia was obtained with a spinal anesthetic, placed supine on the operating table.  Left hip was then prepped and draped in the usual sterile fashion after the  patient was placed on the Hana bed.  Attention was then turned to the left hip where an incision was made for an anterior approach to the hip.  Subcutaneous tissues taken to the level of the tensor fascia.  Tensor fascia was divided in line with its  fibers.  The muscle was finger dissected and retractors put in place above and below the neck.  Provisional  capsulotomy was undertaken with stitches placed anteriorly and posteriorly and the capsule was released anteriorly.  Attention was then turned  towards provisional neck cut, which was made and the head ball was removed.  Retractors were put in place anterior and posterior to the acetabulum.  Acetabulum was sequentially reamed to a level of 55 mm and a 56 mm Pinnacle porous coated cup was  hammered into place 45 degrees of lateral opening, 30 degrees of anteversion.  The neutral liner was placed at this point, accepting a 36 mm hip ball.  Attention was then turned to the stem.  The hip was externally rotated, extended and adducted and then  the canal was opened with a cookie cutter followed by a chili pepper followed by sequential rasping up to a level of 13 and the 13 stem was opened and placed.  We did a couple of fluoroscopic images making sure that we had good fit and fill and length  and once the 13 was chosen as the appropriate size, it was opened and placed.  We then trialed the 8.5 head ball.  Got perfect offset and symmetric leg lengths and at that point the final head ball was opened and placed.  The hip was irrigated  thoroughly.  The capsule was closed with interrupted sutures and running sutures.  The tensor fascia was closed with 0 Vicryl running, the skin with 0 and 2-0 Vicryl and 3-0 Monocryl subcuticular.  Benzoin and Steri-Strips were applied.  Sterile  compressive dressing was applied.  The patient was taken to recovery was noted to  be in satisfactory condition.  Estimated blood loss for procedure was 300 mL.  Gaspar Skeeters was present for the entire case and assisted by manipulation of the leg,  manipulation of tissues and closing to minimize OR time.  Of note, fluoroscopy was used multiple times throughout the case to assess the positioning of the acetabular component as well as sizing of the femoral stem and the overall leg length.  TN/NUANCE  D:02/07/2019 T:02/07/2019  JOB:[id number]

## 2019-02-08 DIAGNOSIS — M1612 Unilateral primary osteoarthritis, left hip: Secondary | ICD-10-CM | POA: Diagnosis not present

## 2019-02-08 LAB — CBC
HCT: 42 % (ref 39.0–52.0)
Hemoglobin: 13.5 g/dL (ref 13.0–17.0)
MCH: 34.3 pg — ABNORMAL HIGH (ref 26.0–34.0)
MCHC: 32.1 g/dL (ref 30.0–36.0)
MCV: 106.6 fL — ABNORMAL HIGH (ref 80.0–100.0)
Platelets: 146 10*3/uL — ABNORMAL LOW (ref 150–400)
RBC: 3.94 MIL/uL — ABNORMAL LOW (ref 4.22–5.81)
RDW: 12.9 % (ref 11.5–15.5)
WBC: 11.1 10*3/uL — ABNORMAL HIGH (ref 4.0–10.5)
nRBC: 0 % (ref 0.0–0.2)

## 2019-02-08 NOTE — Progress Notes (Signed)
    Home health agencies that serve [id number].        Home Health Agencies Search Results  Results List Table  Home Health Agency Information Quality of Patient Care Rating Patient Survey Summary Rating  [facility] [phone] 3  out of 5 stars 5 out of 5 stars  [facility] [phone] 4 out of 5 stars 4 out of 5 stars  [facility] [phone] 4 out of 5 stars 5 out of 5 stars  [facility] [phone] 5 out of 5 stars 4 out of 5 stars  [facility] [phone] 4  out of 5 stars 5 out of 5 stars  [facility] [phone] 4 out of 5 stars 4 out of 5 stars  [facility] [phone] 4  out of 5 stars 4 out of 5 stars  [facility] [phone] 2  out of 5 stars 3 out of 5 stars  [facility] [phone] 4  out of 5 stars 3 out of 5 stars  [facility] [phone] 3 out of 5 stars Not Available9  [facility] [phone] 3  out of 5 stars 4 out of 5 stars  [facility] [phone] 3  out of 5 stars 4 out of 5 stars  [facility] [phone] 3  out of 5 stars 4 out of 5 stars  [facility] [phone] 4  out of 5 stars Not Available11  [facility] [phone] 3  out of 5 stars 5 out of 5 stars  [facility] [phone] 4  out of 5 stars 4 out of 5 stars   Home Health Footnotes  Footnote number Footnote as displayed on Home Health Compare  1 This agency provides services under a federal waiver program to non-traditional, chronic long term population.  2 This agency provides services to a special needs population.  3 Not Available.  4 The number of patient episodes for this measure is too small to report.  5 This measure currently does not have data or provider has been certified/recertified for less  than 6 months.  6 The national average for this measure is not provided because of state-to-state differences in data collection.  7 Medicare is not displaying rates for this measure for any home health agency, because of an issue with the data.  8 There were problems with the data and they are being corrected.  9 Zero, or very few, patients met the survey's rules for inclusion. The scores shown, if any, reflect a very small number of surveys and may not accurately tell how an agency is doing.  10 Survey results are based on less than 12 months of data.  11 Fewer than 70 patients completed the survey. Use the scores shown, if any, with caution as the number of surveys may be too low to accurately tell how an agency is doing.  12 No survey results are available for this period.  13 Data suppressed by CMS for one or more quarters.    

## 2019-02-08 NOTE — Plan of Care (Signed)
Plan of care reviewed and discussed with the patient. 

## 2019-02-08 NOTE — TOC Progression Note (Signed)
Transition of Care [facility]) - Progression Note    Patient Details  Name: Fernando Ayers MRN: [id number] Date of Birth: 01-27-1956  Transition of Care [facility]) CM/SW Contact  Joaquin Courts, RN Phone Number: 02/08/2019, 12:52 PM  Clinical Narrative:    Cm spoke with patient at bedside. [facility] with [facility] for HHPT. Reports has rolling walker at home. [employer] to deliver rolling walker to bedside for home use.  Expected Discharge Plan: [place] Barriers to Discharge: No Barriers Identified  Expected Discharge Plan and Services Expected Discharge Plan: [place]   Discharge Planning Services: CM Consult Post Acute Care Choice: [place] arrangements for the past 2 months: Single Family Home Expected Discharge Date: 02/08/19               DME Arranged: 3-N-1 DME Agency: [facility] Date DME Agency Contacted: 02/08/19 Time DME Agency Contacted: [phone] Representative spoke with at DME Agency: [place]: PT [place]: [facility] [place] Date Lind: 02/08/19 Time Dean: [id number] Representative spoke with at [place]: [place]   Social Determinants of Health ([place]) Interventions    Readmission Risk Interventions No flowsheet data found.

## 2019-02-08 NOTE — Progress Notes (Signed)
   02/08/19 1439  PT Visit Information  Last PT Received On 02/08/19 Pt  doing well, reviewed stairs. Pt ready for d/c from PT standpoint  Assistance Needed +1  History of Present Illness 63 yo male s/p L DA THA  Precautions  Precautions None  Restrictions  Weight Bearing Restrictions No  Other Position/Activity Restrictions WBAT  Pain Assessment  Pain Assessment 0-10  Pain Score 4  Pain Location left hip  Pain Descriptors / Indicators Guarding;Sore  Pain Intervention(s) Limited activity within patient's tolerance;Monitored during session;Repositioned  Cognition  Arousal/Alertness Awake/alert  Behavior During Therapy WFL for tasks assessed/performed  Overall Cognitive Status Within Functional Limits for tasks assessed  Bed Mobility  General bed mobility comments in recliner  Transfers  Equipment used Rolling walker (2 wheeled)  Transfers Sit to/from Stand  Sit to Stand Modified independent (Device/Increase time)  General transfer comment pt uses correct and safe hand placement  Ambulation/Gait  Ambulation/Gait assistance Supervision;Modified independent (Device/Increase time)  Gait Distance (Feet) 50 Feet  Assistive device Rolling walker (2 wheeled)  Gait Pattern/deviations Step-to pattern;Decreased weight shift to left  General Gait Details cues for sequence and RW position, improved wt shift, beginning step through gait end of distance; discussed gait progression, length of time pt will likely use RW  before transitioning to cane   Stairs Yes  Stairs assistance Min guard  Stair Management No rails;Forwards;With walker;Step to pattern  Number of Stairs 3  General stair comments cues for technique and sequence  PT - End of Session  Equipment Utilized During Treatment Gait belt  Activity Tolerance Patient tolerated treatment well  Patient left in chair;with call bell/phone within reach;with chair alarm set  Nurse Communication Mobility status   PT - Assessment/Plan  PT  Plan Current plan remains appropriate  PT Visit Diagnosis Difficulty in walking, not elsewhere classified (R26.2)  PT Frequency (ACUTE ONLY) 7X/week  Follow Up Recommendations Follow surgeon's recommendation for DC plan and follow-up therapies  PT equipment None recommended by PT  AM-PAC PT "6 Clicks" Mobility Outcome Measure (Version 2)  Help needed turning from your back to your side while in a flat bed without using bedrails? 3  Help needed moving from lying on your back to sitting on the side of a flat bed without using bedrails? 3  Help needed moving to and from a bed to a chair (including a wheelchair)? 3  Help needed standing up from a chair using your arms (e.g., wheelchair or bedside chair)? 3  Help needed to walk in hospital room? 3  Help needed climbing 3-5 steps with a railing?  3  6 Click Score 18  Consider Recommendation of Discharge To: Home with [facility]  PT Goal Progression  Progress towards PT goals Progressing toward goals  Acute Rehab PT Goals  PT Goal Formulation With patient  Time For Goal Achievement 02/14/19  PT Time Calculation  PT Start Time (ACUTE ONLY) 1407  PT Stop Time (ACUTE ONLY) 1420  PT Time Calculation (min) (ACUTE ONLY) 13 min  PT General Charges  $$ ACUTE PT VISIT 1 Visit  PT Treatments  $Gait Training 8-22 mins

## 2019-02-08 NOTE — Discharge Summary (Signed)
Patient ID: Fernando Ayers MRN: [id number] DOB/AGE: 65/03/1955 65 y.o.  Admit date: 02/07/2019 Discharge date: 02/08/2019  Admission Diagnoses:  Principal Problem:   Primary osteoarthritis of left hip   Discharge Diagnoses:  Same  Past Medical History:  Diagnosis Date  . Arthritis   . Hypertension     Surgeries: Procedure(s): TOTAL HIP ARTHROPLASTY ANTERIOR APPROACH on 02/07/2019   Consultants:   Discharged Condition: Improved  Hospital Course: Fernando FalconerRudolph Kleckley Ayers is an 63 y.o. male who was admitted 02/07/2019 for operative treatment ofPrimary osteoarthritis of left hip. Patient has severe unremitting pain that affects sleep, daily activities, and work/hobbies. After pre-op clearance the patient was taken to the operating room on 02/07/2019 and underwent  Procedure(s): TOTAL HIP ARTHROPLASTY ANTERIOR APPROACH.    Postop day 1 patient was doing well.  He was sitting up at the bedside chair.  His pain was controlled.  Dressing was intact.  He had worked with physical therapy and felt ready for discharge.  Left hip dressing in place.  Hip is in a normal resting position with apparent symmetric leg lengths.  Endorses sensation to light touch distally about the leg.  Leg is warm and well-perfused.  Patient was given perioperative antibiotics:  Anti-infectives (From admission, onward)   Start     Dose/Rate Route Frequency Ordered Stop   02/07/19 1800  vancomycin (VANCOCIN) IVPB 1000 mg/200 mL premix     1,000 mg 200 mL/hr over 60 Minutes Intravenous Every 12 hours 02/07/19 1108 02/07/19 1833   02/07/19 0600  vancomycin (VANCOCIN) IVPB 1000 mg/200 mL premix     1,000 mg 200 mL/hr over 60 Minutes Intravenous On call to O.R. 02/07/19 0540 02/07/19 0747       Patient was given sequential compression devices, early ambulation, and chemoprophylaxis to prevent DVT.  Patient benefited maximally from hospital stay and there were no complications.    Recent vital signs:  Patient Vitals  for the past 24 hrs:  BP Temp Temp src Pulse Resp SpO2  02/08/19 0530 138/90 98.4 F (36.9 C) - 64 18 100 %  02/08/19 0154 117/77 98.4 F (36.9 C) - 61 18 100 %  02/07/19 2040 - - - 64 16 -  02/07/19 2005 115/62 98.8 F (37.1 C) - 70 16 100 %  02/07/19 1730 122/83 98.6 F (37 C) - 75 15 99 %  02/07/19 1427 (!) 145/83 98.6 F (37 C) Oral 77 14 100 %  02/07/19 1320 (!) 147/86 98.4 F (36.9 C) Oral 87 13 100 %  02/07/19 1242 102/68 - - 65 - 100 %  02/07/19 1217 (!) 93/53 98.3 F (36.8 C) Oral (!) 56 15 99 %  02/07/19 1113 (!) 163/100 98.8 F (37.1 C) Oral 92 15 100 %  02/07/19 1100 (!) 180/89 - - 97 14 100 %  02/07/19 1045 (!) 162/105 98.5 F (36.9 C) - 85 (!) 21 100 %  02/07/19 1030 (!) 159/90 - - 73 17 100 %  02/07/19 1015 (!) 159/82 - - 71 18 100 %  02/07/19 1000 (!) 141/88 - - 80 16 100 %  02/07/19 0945 134/83 - - 82 14 100 %  02/07/19 0930 127/74 97.7 F (36.5 C) - 83 17 100 %     Recent laboratory studies:  Recent Labs    02/08/19 0259  WBC 11.1*  HGB 13.5  HCT 42.0  PLT 146*     Discharge Medications:   Allergies as of 02/08/2019      Reactions  Penicillins Rash   Did it involve swelling of the face/tongue/throat, SOB, or low BP? No Did it involve sudden or severe rash/hives, skin peeling, or any reaction on the inside of your mouth or nose? No Did you need to seek medical attention at a hospital or doctor's office? No When did it last happen?childhood allergy If all above answers are "NO", may proceed with cephalosporin use.      Medication List    STOP taking these medications   meloxicam 15 MG tablet Commonly known as: MOBIC     TAKE these medications   acidophilus Caps capsule Take 2 capsules by mouth daily.   aspirin EC 325 MG tablet Take 1 tablet (325 mg total) by mouth 2 (two) times daily after a meal. Take x 1 month post op to decrease risk of blood clots.   CURCUMAX PRO PO Take 2 capsules by mouth daily.   docusate sodium 100  MG capsule Commonly known as: Colace Take 1 capsule (100 mg total) by mouth 2 (two) times daily.   Ginkgo Biloba 120 MG Caps Take 120 mg by mouth daily.   irbesartan-hydrochlorothiazide 300-12.5 MG tablet Commonly known as: AVALIDE TAKE 1 TABLET BY MOUTH DAILY   Lysine 500 MG Tabs Take 500 mg by mouth daily.   MAGNESIUM GLYCINATE PO Take 2 capsules by mouth at bedtime.   meclizine 25 MG tablet Commonly known as: ANTIVERT Take 25 mg by mouth 3 (three) times daily as needed for dizziness.   Nattokinase 100 MG Caps Take 100 mg by mouth daily.   OVER THE COUNTER MEDICATION Take 2 tablets by mouth daily. Methylation Complete   OVER THE COUNTER MEDICATION Take 3 capsules by mouth at bedtime. Brain Save Multi   OVER THE COUNTER MEDICATION Take 2 tablets by mouth daily. ecoNugenics Pecta-Sol-C modified citrus pectin   OVER THE COUNTER MEDICATION Take 1 capsule by mouth daily. Blood Sugar Synergy   OVER THE COUNTER MEDICATION Take 1 capsule by mouth daily. Circulation Synergy   oxyCODONE-acetaminophen 5-325 MG tablet Commonly known as: PERCOCET/ROXICET Take 1-2 tablets by mouth every 6 (six) hours as needed for severe pain.   SAW PALMETTO PO Take 1 capsule by mouth at bedtime.   Testosterone 30 MG/ACT Soln Place 2 Pump onto the skin daily.   tiZANidine 2 MG tablet Commonly known as: ZANAFLEX Take 1 tablet (2 mg total) by mouth every 8 (eight) hours as needed for muscle spasms.   Vitamin C Liqd Place 1,000 mg under the tongue daily.   VITAMIN D PO Take 1 capsule by mouth daily.   VITAMIN K2 PO Take 1 capsule by mouth daily.   Zinc Lozg Take 2 lozenges by mouth daily.   ANTIOXIDANT PO Take 4 capsules by mouth daily.            Durable Medical Equipment  (From admission, onward)         Start     Ordered   02/07/19 1109  DME Walker rolling  Once    Question:  Patient needs a walker to treat with the following condition  Answer:  S/P total hip  arthroplasty   02/07/19 1108   02/07/19 1109  DME 3 n 1  Once     02/07/19 1108          Diagnostic Studies: Dg Chest 2 View  Result Date: 02/04/2019 CLINICAL DATA:  63 year old male under preoperative evaluation prior to hip surgery. EXAM: CHEST - 2 VIEW COMPARISON:  No priors. FINDINGS:  Lung volumes are normal. No consolidative airspace disease. No pleural effusions. No pneumothorax. No pulmonary nodule or mass noted. Pulmonary vasculature and the cardiomediastinal silhouette are within normal limits. Atherosclerosis in the thoracic aorta. IMPRESSION: 1.  No radiographic evidence of acute cardiopulmonary disease. 2. Aortic atherosclerosis. Electronically Signed   By: Trudie Reed M.D.   On: 02/04/2019 18:45   Dg C-arm 1-60 Min-no Report  Result Date: 02/07/2019 Fluoroscopy was utilized by the requesting physician.  No radiographic interpretation.   Dg Hip Operative Unilat W Or W/o Pelvis Left  Result Date: 02/07/2019 CLINICAL DATA:  Left total hip replacement. EXAM: OPERATIVE left HIP (WITH PELVIS IF PERFORMED) to VIEWS TECHNIQUE: Fluoroscopic spot image(s) were submitted for interpretation post-operatively. COMPARISON:  Radiographs of the left hip 11/27/2018 FINDINGS: Multiple intraoperative images are submitted from left total hip arthroplasty. The acetabular and femoral components appear well seated. No adverse features identified. IMPRESSION: Intraoperative images submitted from left total hip arthroplasty, as described. Electronically Signed   By: Jackey Loge DO   On: 02/07/2019 09:08    Disposition: Discharge disposition: 01-Home or Self Care       Discharge Instructions    Call MD / Call 911   Complete by: As directed    If you experience chest pain or shortness of breath, CALL 911 and be transported to the hospital emergency room.  If you develope a fever above 101 F, pus (white drainage) or increased drainage or redness at the wound, or calf pain, call your  surgeon's office.   Constipation Prevention   Complete by: As directed    Drink plenty of fluids.  Prune juice may be helpful.  You may use a stool softener, such as Colace (over the counter) 100 mg twice a day.  Use MiraLax (over the counter) for constipation as needed.   Diet - low sodium heart healthy   Complete by: As directed    Increase activity slowly as tolerated   Complete by: As directed       Follow-up Information    Jodi Geralds, MD. Schedule an appointment as soon as possible for a visit in 2 weeks.   Specialty: Orthopedic Surgery Contact information: [address] [place] [address] [phone]            Signed: Terance Hart 02/08/2019, 7:19 AM

## 2019-02-08 NOTE — Progress Notes (Signed)
Physical Therapy Treatment Patient Details Name: Fernando Ayers MRN: [id number] DOB: October 30, 1955 Today's Date: 02/08/2019    History of Present Illness 63 yo male s/p L DA THA    PT Comments    Pt progressing very well. Will see for second session to review stairs.    Follow Up Recommendations  Follow surgeon's recommendation for DC plan and follow-up therapies     Equipment Recommendations  None recommended by PT    Recommendations for Other Services       Precautions / Restrictions Precautions Precautions: None Restrictions Weight Bearing Restrictions: No Other Position/Activity Restrictions: WBAT    Mobility  Bed Mobility               General bed mobility comments: in recliner  Transfers Overall transfer level: Needs assistance Equipment used: Rolling walker (2 wheeled) Transfers: Sit to/from Stand Sit to Stand: Supervision         General transfer comment: cues for hand placement  Ambulation/Gait Ambulation/Gait assistance: Supervision;Min guard Gait Distance (Feet): 200 Feet Assistive device: Rolling walker (2 wheeled) Gait Pattern/deviations: Step-to pattern;Decreased weight shift to left     General Gait Details: cues for sequence and RW position, improved wt shift, beginning step through gait end of distance    Stairs             Wheelchair Mobility    Modified Rankin (Stroke Patients Only)       Balance                                            Cognition Arousal/Alertness: Awake/alert Behavior During Therapy: WFL for tasks assessed/performed Overall Cognitive Status: Within Functional Limits for tasks assessed                                        Exercises Total Joint Exercises Ankle Circles/Pumps: AROM;Both;10 reps Quad Sets: AROM;Both;10 reps Short Arc Quad: AROM;Left;10 reps Heel Slides: AAROM;Left;10 reps(using gait belt for assist ) Hip ABduction/ADduction: AROM;Left;10  reps;AAROM    General Comments        Pertinent Vitals/Pain Pain Assessment: 0-10 Pain Score: 4  Pain Location: left hip Pain Descriptors / Indicators: Guarding;Sore Pain Intervention(s): Limited activity within patient's tolerance;Monitored during session;Premedicated before session;Repositioned;Ice applied    Home Living                      Prior Function            PT Goals (current goals can now be found in the care plan section) Acute Rehab PT Goals PT Goal Formulation: With patient Time For Goal Achievement: 02/14/19 Progress towards PT goals: Progressing toward goals    Frequency    7X/week      PT Plan Current plan remains appropriate    Co-evaluation              AM-PAC PT "6 Clicks" Mobility   Outcome Measure  Help needed turning from your back to your side while in a flat bed without using bedrails?: A Little Help needed moving from lying on your back to sitting on the side of a flat bed without using bedrails?: A Little Help needed moving to and from a bed to a chair (including a wheelchair)?: A Little Help  needed standing up from a chair using your arms (e.g., wheelchair or bedside chair)?: A Little Help needed to walk in hospital room?: A Little Help needed climbing 3-5 steps with a railing? : A Little 6 Click Score: 18    End of Session Equipment Utilized During Treatment: Gait belt Activity Tolerance: Patient tolerated treatment well Patient left: in chair;with call bell/phone within reach;with chair alarm set Nurse Communication: Mobility status PT Visit Diagnosis: Difficulty in walking, not elsewhere classified (R26.2)     Time: 3546-5681 PT Time Calculation (min) (ACUTE ONLY): 23 min  Charges:  $Gait Training: 8-22 mins $Therapeutic Exercise: 8-22 mins                     Drucilla Chalet, PT  Pager: [phone] Acute Rehab Dept [facility]): [phone]   02/08/2019    [facility] 02/08/2019, 10:41 AM

## 2019-02-10 ENCOUNTER — Encounter (HOSPITAL_COMMUNITY): Payer: Self-pay | Admitting: Orthopedic Surgery

## 2019-02-25 ENCOUNTER — Ambulatory Visit (INDEPENDENT_AMBULATORY_CARE_PROVIDER_SITE_OTHER): Payer: Medicare HMO | Admitting: Sports Medicine

## 2019-02-25 DIAGNOSIS — M1712 Unilateral primary osteoarthritis, left knee: Secondary | ICD-10-CM

## 2019-02-25 DIAGNOSIS — Z96642 Presence of left artificial hip joint: Secondary | ICD-10-CM

## 2019-02-25 NOTE — Assessment & Plan Note (Signed)
Left knee was completely pain-free after the fourth and final Orthovisc, he then had a hip replacement, and its likely that the manipulation of the extremity during the replacement has irritated his knee, he is having some swelling and pain but difficult to discern whether or not this is coming from the more proximal joint. We can revisit this in 1 month after things have calmed down.

## 2019-02-25 NOTE — Assessment & Plan Note (Signed)
Doing well 

## 2019-02-25 NOTE — Progress Notes (Signed)
Virtual Visit via WebEx/MyChart   I connected with  Fernando Ayers  on 02/25/19 via WebEx/MyChart/Doximity Video and verified that I am speaking with the correct person using two identifiers.   I discussed the limitations, risks, security and privacy concerns of performing an evaluation and management service by WebEx/MyChart/Doximity Video, including the higher likelihood of inaccurate diagnosis and treatment, and the availability of in person appointments.  We also discussed the likely need of an additional face to face encounter for complete and high quality delivery of care.  I also discussed with the patient that there may be a patient responsible charge related to this service. The patient expressed understanding and wishes to proceed.  Provider location is either at home or medical facility. Patient location is at their home, different from provider location. People involved in care of the patient during this telehealth encounter were myself, my nurse/medical assistant, and my front office/scheduling team member.  Subjective:    CC: Follow-up  HPI: Lennox Grumbles returns, he is a pleasant 63 year old male, I completed series of Orthovisc and he did extremely well, he then had a hip replacement and had significant knee pain postop.  Per Dr. Luiz Blare this may be related to the manipulation of his knee and lower leg during the hip replacement, everything is tolerable right now.  I reviewed the past medical history, family history, social history, surgical history, and allergies today and no changes were needed.  Please see the problem list section below in epic for further details.  Past Medical History: Past Medical History:  Diagnosis Date  . Arthritis   . Hypertension    Past Surgical History: Past Surgical History:  Procedure Laterality Date  . NO PAST SURGERIES    . TONSILLECTOMY    . TOTAL HIP ARTHROPLASTY Left 02/07/2019   Procedure: TOTAL HIP ARTHROPLASTY ANTERIOR APPROACH;   Surgeon: Jodi Geralds, MD;  Location: [facility];  Service: Orthopedics;  Laterality: Left;   Social History: Social History   Socioeconomic History  . Marital status: Married    Spouse name: Not on file  . Number of children: Not on file  . Years of education: Not on file  . Highest education level: Not on file  Occupational History  . Not on file  Social Needs  . Financial resource strain: Not on file  . Food insecurity    Worry: Not on file    Inability: Not on file  . Transportation needs    Medical: Not on file    Non-medical: Not on file  Tobacco Use  . Smoking status: Never Smoker  . Smokeless tobacco: Never Used  Substance and Sexual Activity  . Alcohol use: Yes    Alcohol/week: 2.0 - 3.0 standard drinks    Types: 2 - 3 Standard drinks or equivalent per week    Comment: weekly  . Drug use: Never  . Sexual activity: Not on file  Lifestyle  . Physical activity    Days per week: Not on file    Minutes per session: Not on file  . Stress: Not on file  Relationships  . Social Musician on phone: Not on file    Gets together: Not on file    Attends religious service: Not on file    Active member of club or organization: Not on file    Attends meetings of clubs or organizations: Not on file    Relationship status: Not on file  Other Topics Concern  . Not  on file  Social History Narrative  . Not on file   Family History: No family history on file. Allergies: Allergies  Allergen Reactions  . Penicillins Rash    Did it involve swelling of the face/tongue/throat, SOB, or low BP? No Did it involve sudden or severe rash/hives, skin peeling, or any reaction on the inside of your mouth or nose? No Did you need to seek medical attention at a hospital or doctor's office? No When did it last happen?childhood allergy If all above answers are "NO", may proceed with cephalosporin use.    Medications: See med rec.  Review of Systems: No fevers, chills,  night sweats, weight loss, chest pain, or shortness of breath.   Objective:    General: Speaking full sentences, no audible heavy breathing.  Sounds alert and appropriately interactive.  Appears well.  Face symmetric.  Extraocular movements intact.  Pupils equal and round.  No nasal flaring or accessory muscle use visualized.  No other physical exam performed due to the non-physical nature of this visit.  Impression and Recommendations:    History of total left hip arthroplasty Doing well.  Primary osteoarthritis of left knee Left knee was completely pain-free after the fourth and final Orthovisc, he then had a hip replacement, and its likely that the manipulation of the extremity during the replacement has irritated his knee, he is having some swelling and pain but difficult to discern whether or not this is coming from the more proximal joint. We can revisit this in 1 month after things have calmed down.   I discussed the above assessment and treatment plan with the patient. The patient was provided an opportunity to ask questions and all were answered. The patient agreed with the plan and demonstrated an understanding of the instructions.   The patient was advised to call back or seek an in-person evaluation if the symptoms worsen or if the condition fails to improve as anticipated.   I provided 25 minutes of non-face-to-face time during this encounter, 15 minutes of additional time was needed to gather information, review chart, records, communicate/coordinate with staff remotely, troubleshooting the multiple errors that we get every time when trying to do video calls through the electronic medical record, WebEx, and Doximity, restart the encounter multiple times due to instability of the software, as well as complete documentation.   ___________________________________________ Gwen Her. Dianah Field, M.D., ABFM., CAQSM. Primary Care and Sports Medicine [facility]  [facility]  Adjunct Professor of [employer]

## 2019-04-01 ENCOUNTER — Ambulatory Visit (INDEPENDENT_AMBULATORY_CARE_PROVIDER_SITE_OTHER): Payer: Medicare HMO | Admitting: Sports Medicine

## 2019-04-01 ENCOUNTER — Other Ambulatory Visit: Payer: Self-pay | Admitting: Sports Medicine

## 2019-04-01 DIAGNOSIS — M1712 Unilateral primary osteoarthritis, left knee: Secondary | ICD-10-CM

## 2019-04-01 MED ORDER — MELOXICAM 15 MG PO TABS: ORAL_TABLET | ORAL | 3 refills | Status: AC

## 2019-04-01 NOTE — Assessment & Plan Note (Signed)
Did well after his fourth and final Orthovisc, after his hip replacement the manipulation in the operating room irritated his knee, things have gone back to baseline now and he is happy with how things are going. Has a bit of swelling but no pain, he can ice it 20 minutes 3-4 times a day and stay active, return as needed.

## 2019-04-01 NOTE — Progress Notes (Signed)
    Procedures performed today:    None.  Independent interpretation of tests performed by another provider:   None.  Impression and Recommendations:    Primary osteoarthritis of left knee Did well after his fourth and final Orthovisc, after his hip replacement the manipulation in the operating room irritated his knee, things have gone back to baseline now and he is happy with how things are going. Has a bit of swelling but no pain, he can ice it 20 minutes 3-4 times a day and stay active, return as needed.    ___________________________________________ Ihor Austin. Benjamin Stain, M.D., ABFM., CAQSM. Primary Care and Sports Medicine [facility]  Adjunct Instructor of Family Medicine  [employer]

## 2020-03-29 ENCOUNTER — Ambulatory Visit (INDEPENDENT_AMBULATORY_CARE_PROVIDER_SITE_OTHER): Payer: Medicare HMO | Admitting: Sports Medicine

## 2020-03-29 ENCOUNTER — Ambulatory Visit (INDEPENDENT_AMBULATORY_CARE_PROVIDER_SITE_OTHER): Payer: Medicare HMO

## 2020-03-29 ENCOUNTER — Other Ambulatory Visit: Payer: Self-pay

## 2020-03-29 DIAGNOSIS — M5030 Other cervical disc degeneration, unspecified cervical region: Secondary | ICD-10-CM | POA: Diagnosis not present

## 2020-03-29 DIAGNOSIS — L989 Disorder of the skin and subcutaneous tissue, unspecified: Secondary | ICD-10-CM | POA: Insufficient documentation

## 2020-03-29 MED ORDER — PREDNISONE 50 MG PO TABS: ORAL_TABLET | ORAL | 0 refills | Status: AC

## 2020-03-29 NOTE — Progress Notes (Signed)
    Procedures performed today:    None.  Independent interpretation of notes and tests performed by another provider:   None.  Brief History, Exam, Impression, and Recommendations:    DDD (degenerative disc disease), cervical Multilevel cervical DDD, historically had a right C8 distribution radiculopathy, did really well after a cervical epidural back in 2020. Now having recurrence of discomfort, better with abduction all consistent with a radicular source of pain, adding 5 days of prednisone, home rehab exercises, return to see me in 6 weeks, MRI for epidural planning if no better.   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 If there more than 3 months I am happy to do shave biopsies of these in the office in a 30-minute slot.    ___________________________________________ Ihor Austin. Benjamin Stain, M.D., ABFM., CAQSM. Primary Care and Sports Medicine [facility]  Adjunct Instructor of Family Medicine  [employer]

## 2020-03-29 NOTE — Assessment & Plan Note (Signed)
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 If there more than 3 months I am happy to do shave biopsies of these in the office in a 30-minute slot.

## 2020-03-29 NOTE — Assessment & Plan Note (Signed)
Multilevel cervical DDD, historically had a right C8 distribution radiculopathy, did really well after a cervical epidural back in 2020. Now having recurrence of discomfort, better with abduction all consistent with a radicular source of pain, adding 5 days of prednisone, home rehab exercises, return to see me in 6 weeks, MRI for epidural planning if no better.

## 2020-03-31 ENCOUNTER — Telehealth: Payer: Self-pay | Admitting: Sports Medicine

## 2020-03-31 DIAGNOSIS — M79672 Pain in left foot: Secondary | ICD-10-CM

## 2020-03-31 DIAGNOSIS — M79671 Pain in right foot: Secondary | ICD-10-CM | POA: Insufficient documentation

## 2020-03-31 NOTE — Telephone Encounter (Signed)
Dr Karie Schwalbe   I spoke with Fernando Ayers about his Dermatology referral and while on the phone he inquired about a referral to get new orthotics he thought you were going to refer him to Dr. Roderick Pee at [facility] to have this done. Please advise  Arline Asp

## 2020-03-31 NOTE — Assessment & Plan Note (Signed)
Orthotics have worked really well in the past, I would like Dr. Jordan Likes to see him and potentially build him a new set of orthotics since we do not do them anymore here.

## 2020-03-31 NOTE — Telephone Encounter (Signed)
Yes I may have forgotten to put that in, ordering it now.

## 2020-04-01 ENCOUNTER — Ambulatory Visit: Payer: Medicare HMO | Admitting: Family Medicine

## 2020-04-01 ENCOUNTER — Other Ambulatory Visit: Payer: Self-pay

## 2020-04-01 DIAGNOSIS — M79671 Pain in right foot: Secondary | ICD-10-CM

## 2020-04-01 DIAGNOSIS — M79672 Pain in left foot: Secondary | ICD-10-CM

## 2020-04-01 NOTE — Assessment & Plan Note (Signed)
Gets improvement with previous custom orthotics.  -Counseled on home exercise therapy and supportive care. -Orthotics.

## 2020-04-01 NOTE — Progress Notes (Signed)
  Fernando Ayers - 65 y.o. male MRN [id number]  Date of birth: Nov 30, 1955  SUBJECTIVE:  Including CC & ROS.  No chief complaint on file.   Fernando Ayers is a 65 y.o. male that is presenting with bilateral foot pain and low back pain.  His symptoms have improved significantly with custom orthotics.  His previous pair are made about 5 years ago.   Review of Systems See HPI   HISTORY: Past Medical, Surgical, Social, and Family History Reviewed & Updated per EMR.   Pertinent Historical Findings include:  Past Medical History:  Diagnosis Date  . Arthritis   . Hypertension     Past Surgical History:  Procedure Laterality Date  . NO PAST SURGERIES    . TONSILLECTOMY    . TOTAL HIP ARTHROPLASTY Left 02/07/2019   Procedure: TOTAL HIP ARTHROPLASTY ANTERIOR APPROACH;  Surgeon: Jodi Geralds, MD;  Location: [facility];  Service: Orthopedics;  Laterality: Left;    No family history on file.  Social History   Socioeconomic History  . Marital status: Married    Spouse name: Not on file  . Number of children: Not on file  . Years of education: Not on file  . Highest education level: Not on file  Occupational History  . Not on file  Tobacco Use  . Smoking status: Never Smoker  . Smokeless tobacco: Never Used  Vaping Use  . Vaping Use: Never used  Substance and Sexual Activity  . Alcohol use: Yes    Alcohol/week: 2.0 - 3.0 standard drinks    Types: 2 - 3 Standard drinks or equivalent per week    Comment: weekly  . Drug use: Never  . Sexual activity: Not on file  Other Topics Concern  . Not on file  Social History Narrative  . Not on file   Social Determinants of Health   Financial Resource Strain: Not on file  Food Insecurity: Not on file  Transportation Needs: Not on file  Physical Activity: Not on file  Stress: Not on file  Social Connections: Not on file  Intimate Partner Violence: Not on file     PHYSICAL EXAM:  VS: BP 134/84   Ht 5\' 9"  (1.753 m)   Wt 185 lb  (83.9 kg)   BMI 27.32 kg/m  Physical Exam Gen: NAD, alert, cooperative with exam, well-appearing MSK:  Right and left foot: Pes cavus. Normal strength resistance. Neurovascular intact  Patient was fitted for a standard, cushioned, semi-rigid orthotic. The orthotic was heated and afterward the patient stood on the orthotic blank positioned on the orthotic stand. The patient was positioned in subtalar neutral position and 10 degrees of ankle dorsiflexion in a weight bearing stance. After completion of molding, a stable base was applied to the orthotic blank. The blank was ground to a stable position for weight bearing. Size: 10 Pairs: 2 Base: Blue EVA Additional Posting and Padding: None The patient ambulated these, and they were very comfortable.    ASSESSMENT & PLAN:   Foot pain, bilateral Gets improvement with previous custom orthotics.  -Counseled on home exercise therapy and supportive care. -Orthotics.

## 2020-05-10 ENCOUNTER — Ambulatory Visit (INDEPENDENT_AMBULATORY_CARE_PROVIDER_SITE_OTHER): Payer: Medicare HMO | Admitting: Sports Medicine

## 2020-05-10 ENCOUNTER — Other Ambulatory Visit: Payer: Self-pay

## 2020-05-10 DIAGNOSIS — M5030 Other cervical disc degeneration, unspecified cervical region: Secondary | ICD-10-CM

## 2020-05-10 DIAGNOSIS — M7541 Impingement syndrome of right shoulder: Secondary | ICD-10-CM

## 2020-05-10 NOTE — Progress Notes (Signed)
    Procedures performed today:    None.  Independent interpretation of notes and tests performed by another provider:   None.  Brief History, Exam, Impression, and Recommendations:    DDD (degenerative disc disease), cervical Known multilevel cervical DDD, historically has right C7 and C8 distribution radiculopathy, initially did really well after cervical epidural back in 2020. He did have recurrence, we added home rehab exercises, unfortunately he got COVID 2 weeks ago, and has been somewhat down on his conditioning. Continues to have discomfort periscapular right-sided with numbness in the C7 and C8 distribution on the right, so we will proceed with a right C6-C7 interlaminar epidural, return to see me 1 month after.  Impingement syndrome, shoulder, right Pain of the deltoid, worse with overhead activities, impingement symptoms and signs. He will change his activities in the gym, and return to see me in a month, if insufficient improvement after cervical epidural we will do a subacromial injection.    ___________________________________________ Ihor Austin. Benjamin Stain, M.D., ABFM., CAQSM. Primary Care and Sports Medicine [facility]  Adjunct Instructor of Family Medicine  [employer]

## 2020-05-10 NOTE — Assessment & Plan Note (Signed)
Pain of the deltoid, worse with overhead activities, impingement symptoms and signs. He will change his activities in the gym, and return to see me in a month, if insufficient improvement after cervical epidural we will do a subacromial injection.

## 2020-05-10 NOTE — Assessment & Plan Note (Signed)
Known multilevel cervical DDD, historically has right C7 and C8 distribution radiculopathy, initially did really well after cervical epidural back in 2020. He did have recurrence, we added home rehab exercises, unfortunately he got COVID 2 weeks ago, and has been somewhat down on his conditioning. Continues to have discomfort periscapular right-sided with numbness in the C7 and C8 distribution on the right, so we will proceed with a right C6-C7 interlaminar epidural, return to see me 1 month after.

## 2020-05-17 ENCOUNTER — Other Ambulatory Visit: Payer: Medicare HMO

## 2020-05-17 ENCOUNTER — Ambulatory Visit
Admission: RE | Admit: 2020-05-17 | Discharge: 2020-05-17 | Disposition: A | Discharge status: Home / Self Care | Payer: Medicare HMO | Source: Ambulatory Visit | Attending: Sports Medicine | Admitting: Sports Medicine

## 2020-05-17 ENCOUNTER — Other Ambulatory Visit: Payer: Self-pay

## 2020-05-17 DIAGNOSIS — M5030 Other cervical disc degeneration, unspecified cervical region: Secondary | ICD-10-CM

## 2020-05-17 MED ORDER — IOPAMIDOL (ISOVUE-M 300) INJECTION 61%
1.0000 mL | Freq: Once | INTRAMUSCULAR | Status: AC
  Administered 2020-05-17: 1 mL via EPIDURAL

## 2020-05-17 MED ORDER — TRIAMCINOLONE ACETONIDE 40 MG/ML IJ SUSP (RADIOLOGY)
60.0000 mg | Freq: Once | INTRAMUSCULAR | Status: AC
  Administered 2020-05-17: 60 mg via EPIDURAL

## 2020-05-17 NOTE — Discharge Instructions (Signed)

## 2020-06-21 ENCOUNTER — Ambulatory Visit (INDEPENDENT_AMBULATORY_CARE_PROVIDER_SITE_OTHER): Payer: Medicare HMO | Admitting: Sports Medicine

## 2020-06-21 ENCOUNTER — Other Ambulatory Visit: Payer: Self-pay

## 2020-06-21 DIAGNOSIS — M5030 Other cervical disc degeneration, unspecified cervical region: Secondary | ICD-10-CM

## 2020-06-21 DIAGNOSIS — M7541 Impingement syndrome of right shoulder: Secondary | ICD-10-CM

## 2020-06-21 MED ORDER — DICLOFENAC SODIUM 2 % EX SOLN: 2.0000 | Freq: Two times a day (BID) | CUTANEOUS | 0 refills | Status: AC

## 2020-06-21 NOTE — Assessment & Plan Note (Signed)
Mild impingement symptoms, improved considerably, continue topical diclofenac which he seems to benefit from, and return to see me as needed. Certainly a subacromial injection with ultrasound guidance would be the next step.

## 2020-06-21 NOTE — Assessment & Plan Note (Signed)
This is a very pleasant 65 year old male, known multilevel cervical DDD, historically has right C7 and C8 distribution radiculitis. He did great after a cervical epidural back in 2020, he had a recurrence of rehab at home rehab exercises, unfortunately continued to have right periscapular discomfort with numbness in a C7 and C8 distribution on the right. We proceeded with another right C6-C7 interlaminar epidural, unfortunately he noted no relief, not even temporary. He did use some of the family members topical Voltaren which seemed to help so I am going to give him some samples of Pennsaid (diclofenac 2% topical). I would like a second opinion from Dr. Yevette Edwards at this juncture.

## 2020-06-21 NOTE — Progress Notes (Signed)
    Procedures performed today:    None.  Independent interpretation of notes and tests performed by another provider:   None.  Brief History, Exam, Impression, and Recommendations:    DDD (degenerative disc disease), cervical This is a very pleasant 65 year old male, known multilevel cervical DDD, historically has right C7 and C8 distribution radiculitis. He did great after a cervical epidural back in 2020, he had a recurrence of rehab at home rehab exercises, unfortunately continued to have right periscapular discomfort with numbness in a C7 and C8 distribution on the right. We proceeded with another right C6-C7 interlaminar epidural, unfortunately he noted no relief, not even temporary. He did use some of the family members topical Voltaren which seemed to help so I am going to give him some samples of Pennsaid (diclofenac 2% topical). I would like a second opinion from Dr. Yevette Edwards at this juncture.  Impingement syndrome, shoulder, right Mild impingement symptoms, improved considerably, continue topical diclofenac which he seems to benefit from, and return to see me as needed. Certainly a subacromial injection with ultrasound guidance would be the next step.    ___________________________________________ Ihor Austin. Benjamin Stain, M.D., ABFM., CAQSM. Primary Care and Sports Medicine [facility]  Adjunct Instructor of Family Medicine  [employer]

## 2020-07-12 IMAGING — DX DG CHEST 2V
2 series · 2 of 2 positions shown · non-contrast
Comparison: No priors.

CLINICAL DATA: 63-year-old male under preoperative evaluation prior
to hip surgery.

EXAM:
CHEST - 2 VIEW

[chest lat]
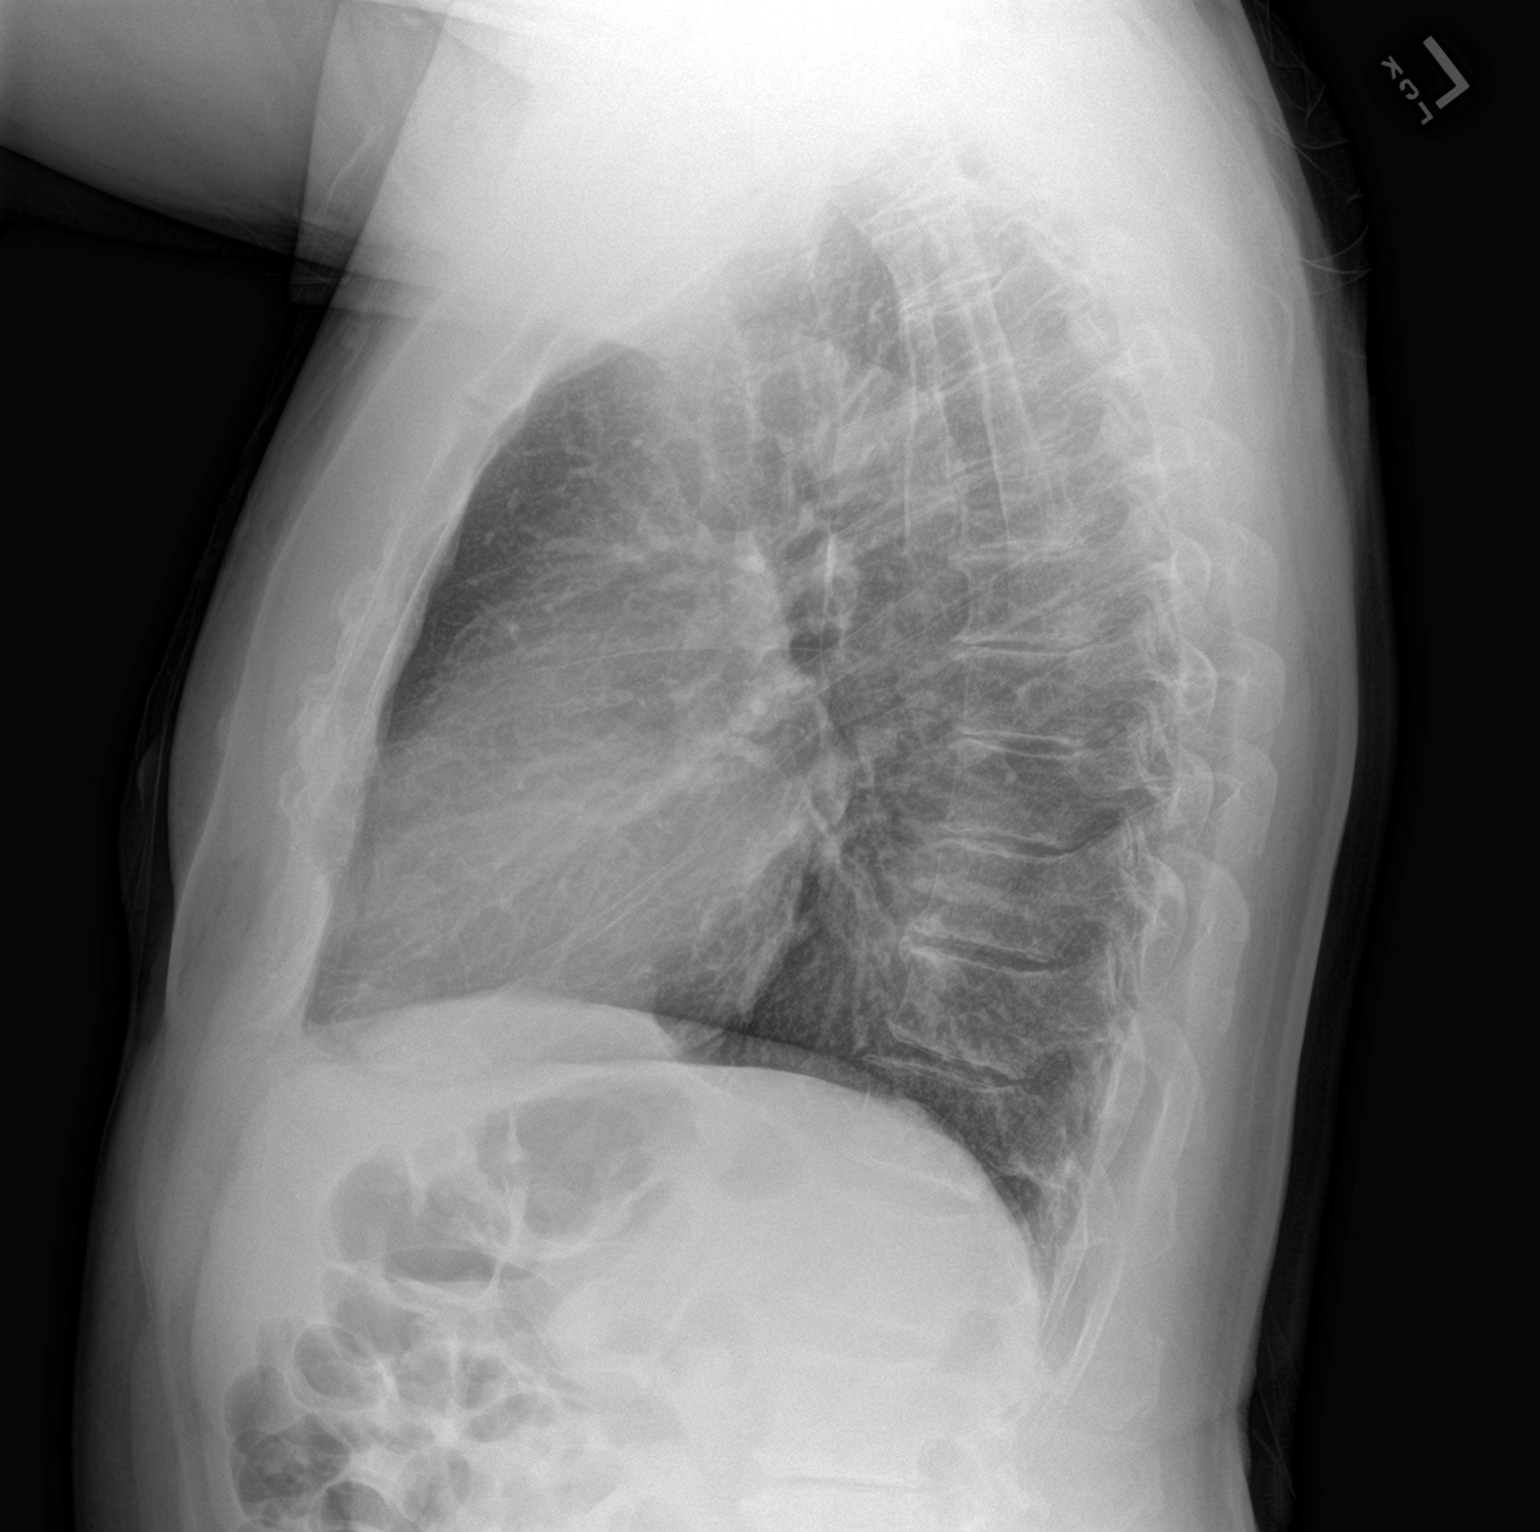

[chest pa]
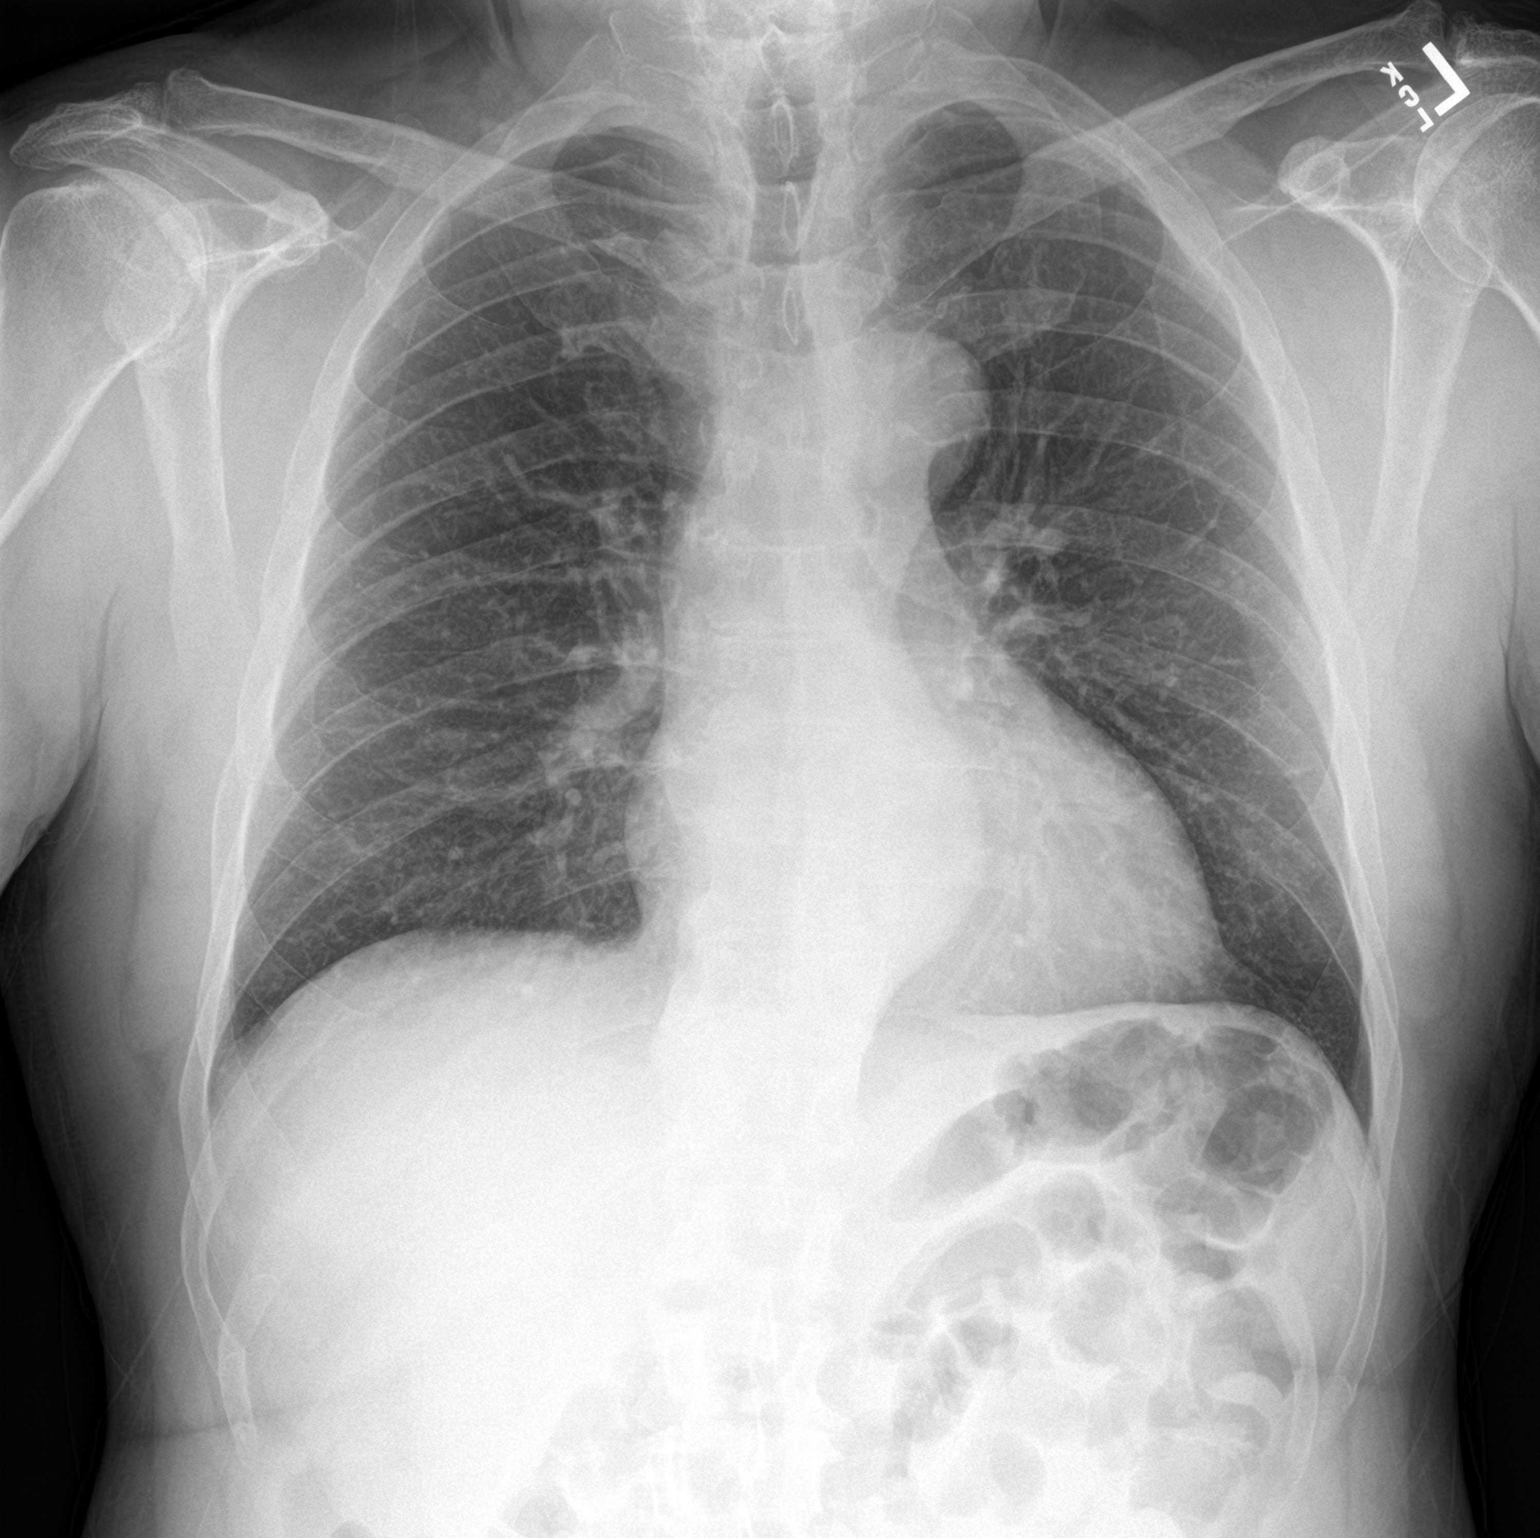

[2 of 2 positions shown; findings below may reference images not displayed]

FINDINGS: Lung volumes are normal. No consolidative airspace disease. No
pleural effusions. No pneumothorax. No pulmonary nodule or mass
noted. Pulmonary vasculature and the cardiomediastinal silhouette
are within normal limits. Atherosclerosis in the thoracic aorta.
IMPRESSION: 1.  No radiographic evidence of acute cardiopulmonary disease.
2. Aortic atherosclerosis.

## 2020-07-15 IMAGING — RF DG HIP (WITH PELVIS) OPERATIVE*L*
1 series · 2 of 2 positions shown · non-contrast
Comparison: Radiographs of the left hip 11/27/2018

CLINICAL DATA: Left total hip replacement.

EXAM:
OPERATIVE left HIP (WITH PELVIS IF PERFORMED) to VIEWS
TECHNIQUE: Fluoroscopic spot image(s) were submitted for interpretation
post-operatively.

[Series 1: run · 2 of 2 slices shown]
[im 1/2]
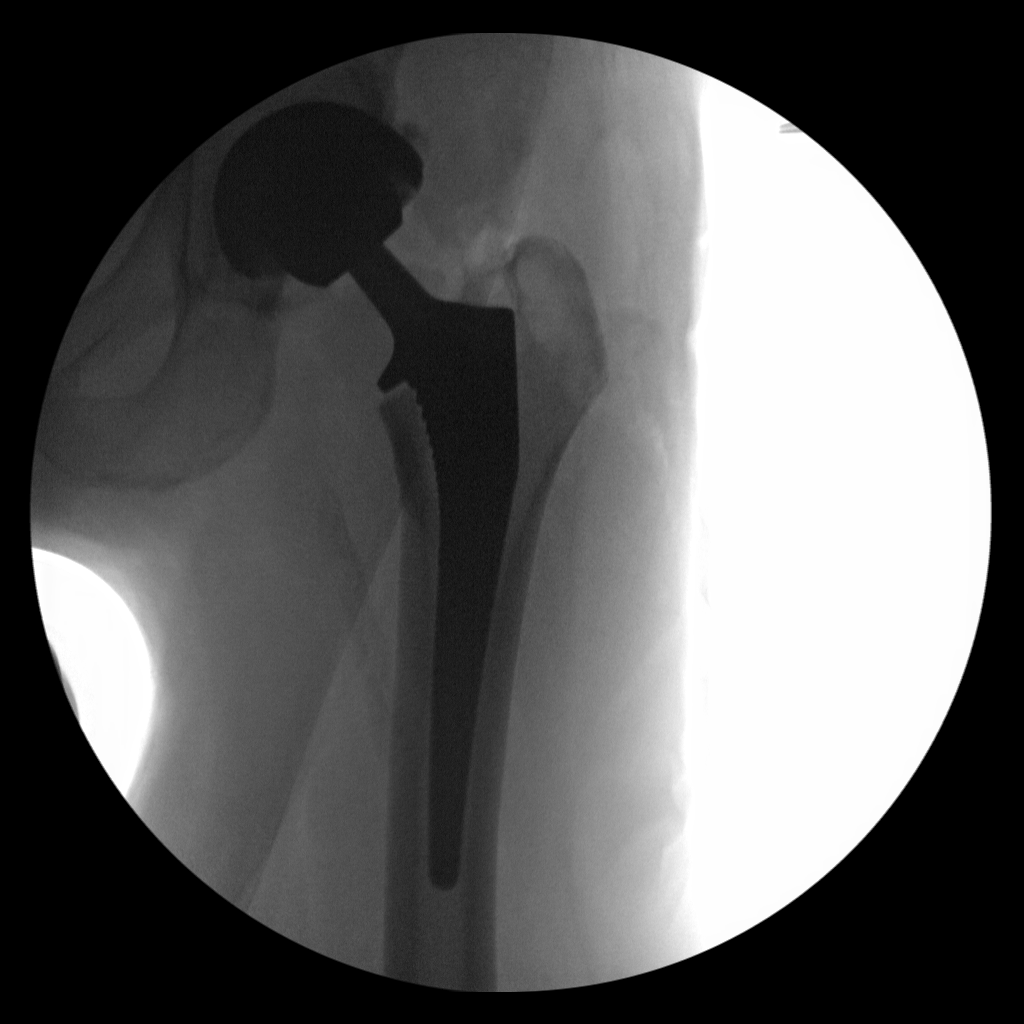
[im 2/2]
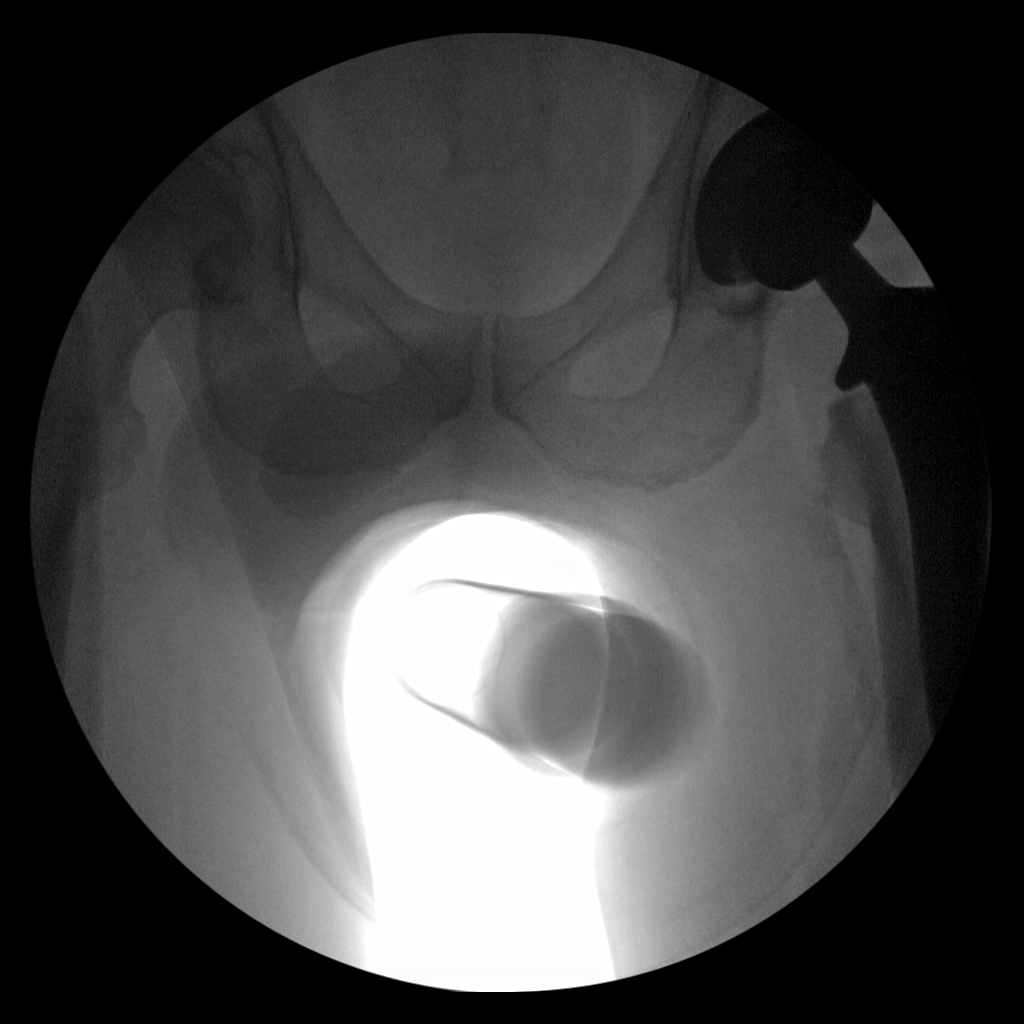

[2 of 2 positions shown; findings below may reference images not displayed]

FINDINGS: Multiple intraoperative images are submitted from left total hip
arthroplasty. The acetabular and femoral components appear well
seated. No adverse features identified.
IMPRESSION: Intraoperative images submitted from left total hip arthroplasty, as
described.

## 2022-07-10 ENCOUNTER — Encounter: Payer: Self-pay | Admitting: *Deleted
